# Patient Record
Sex: Male | Born: 1960
Health system: Southern US, Community
[De-identification: ages and names within clinical notes are randomized; demographics above are authoritative.]

## PROBLEM LIST (undated history)

## (undated) DIAGNOSIS — G4730 Sleep apnea, unspecified: Secondary | ICD-10-CM

## (undated) DIAGNOSIS — I10 Essential (primary) hypertension: Secondary | ICD-10-CM

## (undated) DIAGNOSIS — F0390 Unspecified dementia without behavioral disturbance: Secondary | ICD-10-CM

## (undated) DIAGNOSIS — E785 Hyperlipidemia, unspecified: Secondary | ICD-10-CM

## (undated) DIAGNOSIS — M545 Low back pain, unspecified: Secondary | ICD-10-CM

## (undated) DIAGNOSIS — T7840XA Allergy, unspecified, initial encounter: Secondary | ICD-10-CM

## (undated) DIAGNOSIS — Z87442 Personal history of urinary calculi: Secondary | ICD-10-CM

## (undated) DIAGNOSIS — L237 Allergic contact dermatitis due to plants, except food: Secondary | ICD-10-CM

## (undated) DIAGNOSIS — F419 Anxiety disorder, unspecified: Secondary | ICD-10-CM

## (undated) DIAGNOSIS — J309 Allergic rhinitis, unspecified: Secondary | ICD-10-CM

## (undated) HISTORY — PX: ARTHROSCOPIC REPAIR ACL: SUR80

## (undated) HISTORY — DX: Allergic rhinitis, unspecified: J30.9

## (undated) HISTORY — DX: Hyperlipidemia, unspecified: E78.5

## (undated) HISTORY — DX: Personal history of urinary calculi: Z87.442

## (undated) HISTORY — DX: Allergic contact dermatitis due to plants, except food: L23.7

## (undated) HISTORY — DX: Essential (primary) hypertension: I10

## (undated) HISTORY — DX: Low back pain: M54.5

## (undated) HISTORY — PX: SPINE SURGERY: SHX786

## (undated) HISTORY — DX: Allergy, unspecified, initial encounter: T78.40XA

## (undated) HISTORY — PX: LITHOTRIPSY: SUR834

## (undated) HISTORY — DX: Low back pain, unspecified: M54.50

## (undated) HISTORY — DX: Sleep apnea, unspecified: G47.30

## (undated) HISTORY — PX: TRIGGER FINGER RELEASE: SHX641

## (undated) HISTORY — PX: OTHER SURGICAL HISTORY: SHX169

---

## 1996-08-15 HISTORY — PX: ARTHROSCOPIC REPAIR ACL: SUR80

## 1996-08-15 HISTORY — PX: TRIGGER FINGER RELEASE: SHX641

## 1998-08-15 LAB — FECAL OCCULT BLOOD, GUAIAC: Fecal Occult Blood: NEGATIVE

## 2004-06-18 ENCOUNTER — Ambulatory Visit: Payer: Self-pay | Admitting: Family Medicine

## 2005-01-19 ENCOUNTER — Ambulatory Visit: Payer: Self-pay | Admitting: Family Medicine

## 2005-02-17 ENCOUNTER — Ambulatory Visit: Payer: Self-pay | Admitting: Family Medicine

## 2005-03-21 ENCOUNTER — Ambulatory Visit: Payer: Self-pay | Admitting: Family Medicine

## 2005-04-07 ENCOUNTER — Ambulatory Visit: Payer: Self-pay | Admitting: Family Medicine

## 2006-04-07 ENCOUNTER — Ambulatory Visit: Payer: Self-pay | Admitting: Family Medicine

## 2006-05-11 ENCOUNTER — Ambulatory Visit (HOSPITAL_COMMUNITY): Admission: RE | Admit: 2006-05-11 | Discharge: 2006-05-11 | Payer: Self-pay | Admitting: Urology

## 2006-12-27 ENCOUNTER — Encounter: Payer: Self-pay | Admitting: Family Medicine

## 2007-03-20 ENCOUNTER — Telehealth (INDEPENDENT_AMBULATORY_CARE_PROVIDER_SITE_OTHER): Payer: Self-pay | Admitting: *Deleted

## 2007-05-02 ENCOUNTER — Telehealth: Payer: Self-pay | Admitting: Family Medicine

## 2007-05-02 DIAGNOSIS — M549 Dorsalgia, unspecified: Secondary | ICD-10-CM

## 2007-05-02 DIAGNOSIS — M545 Low back pain, unspecified: Secondary | ICD-10-CM | POA: Insufficient documentation

## 2007-12-31 ENCOUNTER — Encounter: Payer: Self-pay | Admitting: Family Medicine

## 2008-06-27 ENCOUNTER — Ambulatory Visit: Payer: Self-pay | Admitting: Family Medicine

## 2008-06-27 DIAGNOSIS — E785 Hyperlipidemia, unspecified: Secondary | ICD-10-CM | POA: Insufficient documentation

## 2008-06-27 DIAGNOSIS — J309 Allergic rhinitis, unspecified: Secondary | ICD-10-CM | POA: Insufficient documentation

## 2008-07-01 ENCOUNTER — Ambulatory Visit: Payer: Self-pay | Admitting: Pulmonary Disease

## 2008-07-01 DIAGNOSIS — G4710 Hypersomnia, unspecified: Secondary | ICD-10-CM | POA: Insufficient documentation

## 2008-07-01 DIAGNOSIS — G4730 Sleep apnea, unspecified: Secondary | ICD-10-CM

## 2008-07-22 ENCOUNTER — Ambulatory Visit (HOSPITAL_BASED_OUTPATIENT_CLINIC_OR_DEPARTMENT_OTHER): Admission: RE | Admit: 2008-07-22 | Discharge: 2008-07-22 | Payer: Self-pay | Admitting: Pulmonary Disease

## 2008-07-22 ENCOUNTER — Encounter: Payer: Self-pay | Admitting: Pulmonary Disease

## 2008-07-30 ENCOUNTER — Ambulatory Visit: Payer: Self-pay | Admitting: Pulmonary Disease

## 2008-08-04 ENCOUNTER — Encounter: Payer: Self-pay | Admitting: Pulmonary Disease

## 2008-08-07 ENCOUNTER — Encounter: Payer: Self-pay | Admitting: Pulmonary Disease

## 2008-09-02 ENCOUNTER — Encounter: Payer: Self-pay | Admitting: Pulmonary Disease

## 2008-09-04 ENCOUNTER — Ambulatory Visit: Payer: Self-pay | Admitting: Pulmonary Disease

## 2008-10-23 ENCOUNTER — Encounter: Payer: Self-pay | Admitting: Pulmonary Disease

## 2008-10-28 ENCOUNTER — Ambulatory Visit: Payer: Self-pay | Admitting: Pulmonary Disease

## 2008-11-10 ENCOUNTER — Telehealth: Payer: Self-pay | Admitting: Family Medicine

## 2008-12-30 ENCOUNTER — Ambulatory Visit: Payer: Self-pay | Admitting: Family Medicine

## 2008-12-31 LAB — CONVERTED CEMR LAB
Direct LDL: 138.8 mg/dL
HDL: 55.4 mg/dL (ref >39.00)
Total CHOL/HDL Ratio: 4
Triglycerides: 53 mg/dL (ref 0.0–149.0)
VLDL: 10.6 mg/dL (ref 0.0–40.0)

## 2009-03-12 ENCOUNTER — Encounter: Payer: Self-pay | Admitting: Family Medicine

## 2009-04-28 ENCOUNTER — Ambulatory Visit: Payer: Self-pay | Admitting: Family Medicine

## 2009-04-28 DIAGNOSIS — Z9189 Other specified personal risk factors, not elsewhere classified: Secondary | ICD-10-CM | POA: Insufficient documentation

## 2009-04-29 LAB — CONVERTED CEMR LAB
ALT: 31 units/L (ref 0–53)
AST: 26 units/L (ref 0–37)
Albumin: 4.3 g/dL (ref 3.5–5.2)
Alkaline Phosphatase: 40 units/L (ref 39–117)
Bilirubin, Direct: 0 mg/dL (ref 0.0–0.3)
Chloride: 106 meq/L (ref 96–112)
GFR calc non Af Amer: 84.5 mL/min (ref >60)
HCT: 40.9 % (ref 39.0–52.0)
MCHC: 34.9 g/dL (ref 30.0–36.0)
Monocytes Absolute: 0.3 10*3/uL (ref 0.1–1.0)
Neutro Abs: 3.2 10*3/uL (ref 1.4–7.7)
Neutrophils Relative %: 62.2 % (ref 43.0–77.0)
Potassium: 4.7 meq/L (ref 3.5–5.1)
RDW: 12.4 % (ref 11.5–14.6)
TSH: 3.21 microintl units/mL (ref 0.35–5.50)
Total Protein: 7 g/dL (ref 6.0–8.3)
WBC: 5.1 10*3/uL (ref 4.5–10.5)

## 2009-05-08 ENCOUNTER — Ambulatory Visit: Payer: Self-pay | Admitting: Pulmonary Disease

## 2009-05-08 ENCOUNTER — Ambulatory Visit: Payer: Self-pay | Admitting: Family Medicine

## 2009-05-21 ENCOUNTER — Ambulatory Visit: Payer: Self-pay | Admitting: Family Medicine

## 2009-05-21 LAB — CONVERTED CEMR LAB: OCCULT 2: NEGATIVE

## 2009-05-25 ENCOUNTER — Encounter (INDEPENDENT_AMBULATORY_CARE_PROVIDER_SITE_OTHER): Payer: Self-pay | Admitting: *Deleted

## 2009-05-25 LAB — CONVERTED CEMR LAB
Glucose, 1 Hour GTT: 180 mg/dL
Glucose, Fasting: 105 mg/dL — ABNORMAL HIGH (ref 70–99)

## 2009-06-03 ENCOUNTER — Encounter: Payer: Self-pay | Admitting: Pulmonary Disease

## 2009-06-24 ENCOUNTER — Ambulatory Visit: Payer: Self-pay | Admitting: Pulmonary Disease

## 2009-09-29 ENCOUNTER — Telehealth: Payer: Self-pay | Admitting: Family Medicine

## 2009-12-24 ENCOUNTER — Ambulatory Visit: Payer: Self-pay | Admitting: Family Medicine

## 2009-12-24 ENCOUNTER — Encounter: Admission: RE | Admit: 2009-12-24 | Discharge: 2009-12-24 | Payer: Self-pay | Admitting: Family Medicine

## 2009-12-24 DIAGNOSIS — R4589 Other symptoms and signs involving emotional state: Secondary | ICD-10-CM | POA: Insufficient documentation

## 2009-12-24 DIAGNOSIS — M25559 Pain in unspecified hip: Secondary | ICD-10-CM | POA: Insufficient documentation

## 2010-01-05 ENCOUNTER — Encounter: Payer: Self-pay | Admitting: Family Medicine

## 2010-02-16 ENCOUNTER — Encounter: Payer: Self-pay | Admitting: Family Medicine

## 2010-09-12 LAB — CONVERTED CEMR LAB
ALT: 32 units/L (ref 0–53)
AST: 21 units/L (ref 0–37)
Albumin: 4.3 g/dL (ref 3.5–5.2)
BUN: 15 mg/dL (ref 6–23)
Bilirubin, Direct: 0.1 mg/dL (ref 0.0–0.3)
CO2: 32 meq/L (ref 19–32)
Chloride: 105 meq/L (ref 96–112)
Cholesterol: 225 mg/dL (ref 0–200)
Direct LDL: 142.6 mg/dL
Eosinophils Absolute: 0.2 10*3/uL (ref 0.0–0.7)
GFR calc Af Amer: 103 mL/min
GFR calc non Af Amer: 85 mL/min
Glucose, Bld: 100 mg/dL — ABNORMAL HIGH (ref 70–99)
HCT: 43.3 % (ref 39.0–52.0)
Lymphocytes Relative: 28.1 % (ref 12.0–46.0)
Potassium: 4.5 meq/L (ref 3.5–5.1)
RBC: 4.95 M/uL (ref 4.22–5.81)
RDW: 12.1 % (ref 11.5–14.6)
TSH: 2.99 microintl units/mL (ref 0.35–5.50)
Triglycerides: 60 mg/dL (ref 0–149)
VLDL: 12 mg/dL (ref 0–40)
WBC: 5.4 10*3/uL (ref 4.5–10.5)

## 2010-09-14 NOTE — Progress Notes (Signed)
Summary: wants muscle relaxer  Phone Note Call from Patient Call back at Home Phone [phone]   Caller: Patient Call For: Judith Part MD Summary of Call: Pt is complaining of muscle spasms in his back, he says he gets these off and on.  He is asking for a muscle relaxer to be called to [facility] in .  He is using heat and taking ibuprofen. Initial call taken by: Lowella Petties CMA,  September 29, 2009 12:03 PM  Follow-up for Phone Call        can try flexeril with caution (sedation) if worse or any neurologic changes - f/u if not imp 2-3 d f/u px written on EMR for call in  Follow-up by: Judith Part MD,  September 29, 2009 12:40 PM  Additional Follow-up for Phone Call Additional follow up Details #1::        Medication phoned to pharmacy. Left message on voicemail  in detail.  Personalized VM.  Additional Follow-up by: Delilah Shan CMA (AAMA),  September 29, 2009 3:03 PM    New/Updated Medications: FLEXERIL 10 MG TABS (CYCLOBENZAPRINE HCL) 1 by mouth up to three times a day as needed back pain watch for sedation Prescriptions: FLEXERIL 10 MG TABS (CYCLOBENZAPRINE HCL) 1 by mouth up to three times a day as needed back pain watch for sedation  #10 x 0   Entered and Authorized by:   Judith Part MD   Signed by:   Delilah Shan CMA (AAMA) on 09/29/2009   Method used:   Telephoned to ...       [facility] [address] (retail)       [address] [place], [place]  [address]       Ph: [phone]       Fax: [phone]   RxID:   [id number]

## 2010-09-14 NOTE — Letter (Signed)
Summary: [facility]  [facility]   Imported By: Lanelle Bal 02/24/2010 13:45:02  _____________________________________________________________________  External Attachment:    Type:   Image     Comment:   External Document

## 2010-09-14 NOTE — Consult Note (Signed)
Summary: [facility]  [facility]   Imported By: Lanelle Bal 01/20/2010 08:27:59  _____________________________________________________________________  External Attachment:    Type:   Image     Comment:   External Document

## 2010-09-14 NOTE — Assessment & Plan Note (Signed)
Summary: BACK PAIN/DLO   Vital Signs:  Patient profile:   50 year old male Height:      69.5 inches Weight:      179.25 pounds BMI:     26.19 Temp:     98.1 degrees F oral Pulse rate:   60 / minute Pulse rhythm:   regular BP sitting:   128 / 80  (left arm) Cuff size:   regular  Vitals Entered By: Lewanda Rife LPN (Dec 24, 2009 8:08 AM) CC: lower back pain, dullache no spasms at this time but hips are hurting also. Pt is also under alot of stress   History of Present Illness: has had back spasms in past  this is different and will not go away  more of a dull ache  uses flexeril as needed -- since feb spasms are intermittent  low back - right into the middle -- now rad into hips and groin   sitting makes it worse  better when up and around -- walking  seldom takes flexeril -- except for spasm and knocks him out has takes ibuprofen -- it does help , and aleve at times   did x ray and saw ortho in distant past saw scoliosis - but otherwise ok  given flexeril and arthrotec   worse to bend back   is also under a lot of stress - spasms may react to that  no danger situation  it could be making him depressed   family issues -- and does not have a lot of support  up and down with wife and her alcohol use   Allergies (verified): No Known Drug Allergies  Past History:  Past Surgical History: Last updated: 06/27/2008 DRE 3/04, 8/06  Family History: Last updated: 04/28/2009 father- heart problems , DM2 MGF with leukemia  PGF with MI  Maunt with SLE Maunt with cirrhosis (non alcoholic)  Social History: Last updated: 07/01/2008 Never Smoked Alcohol use-yes (occ wine)  Drug use-no married laid off x 2 yrs  Risk Factors: Smoking Status: never (06/27/2008)  Past Medical History: Kidney Stones (recur (11/00) frequent poison ivy Allergic rhinitis Hyperlipidemia low back pain  Review of Systems General:  Denies fatigue, loss of appetite, and malaise. Eyes:   Denies blurring. CV:  Denies chest pain or discomfort, lightheadness, and palpitations. Resp:  Denies cough, shortness of breath, and wheezing. GI:  Denies abdominal pain, bloody stools, change in bowel habits, and indigestion. GU:  Denies discharge, dysuria, and hematuria. MS:  Complains of low back pain and stiffness; denies joint redness, joint swelling, and muscle weakness. Derm:  Denies itching, lesion(s), poor wound healing, and rash. Neuro:  Denies numbness, tingling, and weakness. Psych:  Denies easily tearful, irritability, panic attacks, sense of great danger, and suicidal thoughts/plans. Endo:  Denies excessive thirst and excessive urination. Heme:  Denies abnormal bruising and bleeding.  Physical Exam  General:  Well-developed,well-nourished,in no acute distress; alert,appropriate and cooperative throughout examination Head:  normocephalic, atraumatic, and no abnormalities observed.   Eyes:  vision grossly intact, pupils equal, pupils round, and pupils reactive to light.  no conjunctival pallor, injection or icterus  Mouth:  pharynx pink and moist.   Neck:  supple with full rom and no masses or thyromegally, no JVD or carotid bruit  Chest Wall:  No deformities, masses, tenderness or gynecomastia noted. Lungs:  Normal respiratory effort, chest expands symmetrically. Lungs are clear to auscultation, no crackles or wheezes. Heart:  Normal rate and regular rhythm. S1 and S2 normal  without gallop, murmur, click, rub or other extra sounds. Abdomen:  no suprapubic tenderness or fullness felt  Msk:  tender L3-L5 loss of lordosis in LS  pain on ext and L lat bend - nl twist neg slr nl rom hips  no abd tenderness nl gait  no cva tenderness Extremities:  No clubbing, cyanosis, edema, or deformity noted with normal full range of motion of all joints.   Neurologic:  strength normal in all extremities, sensation intact to light touch, gait normal, and DTRs symmetrical and normal.     Skin:  Intact without suspicious lesions or rashes Cervical Nodes:  No lymphadenopathy noted Inguinal Nodes:  No significant adenopathy Psych:  seems stressed - but talks freely with good eye contact and comm skills and insight   Impression & Recommendations:  Problem # 1:  BACK PAIN (ICD-724.5) Assessment Deteriorated this has changed to be more constant with rad to hips  check ls and hip x rays and update  replace otc with mobic flexeril as needed  heat and gentle stretch  given back handout from [facility]  update if worse or neurol symptoms make plan when results return The following medications were removed from the medication list:    Ibuprofen 200 Mg Tabs (Ibuprofen) .Marland Kitchen... As needed    Aleve 220 Mg Tabs (Naproxen sodium) .Marland Kitchen... As needed His updated medication list for this problem includes:    Flexeril 10 Mg Tabs (Cyclobenzaprine hcl) .Marland Kitchen... 1 by mouth up to three times a day as needed back pain watch for sedation    Mobic 15 Mg Tabs (Meloxicam) .Marland Kitchen... 1 by mouth once daily with food  Orders: Radiology Referral (Radiology) Prescription Created Electronically [phone])  Problem # 2:  STRESS REACTION, ACUTE, WITH EMOTIONAL DISTURBANCE (ICD-308.0) Assessment: New  disc situational stress/ coping mech in detail I feel he would benefit from counseling  will call if he wants a referral- is thinking about it  disc situational stress/coping tech/ support sources/ symtp and tx opt today  Orders: Prescription Created Electronically [phone])  Complete Medication List: 1)  Sudophed 30 Mg Tabs (Pseudoephedrine hcl) .... One by mouth daily as needed 2)  Cpap 12 Cm, Full Face  3)  Nasonex 50 Mcg/act Susp (Mometasone furoate) .... 2 sprays in each nostril once daily as needed 4)  Flexeril 10 Mg Tabs (Cyclobenzaprine hcl) .Marland Kitchen.. 1 by mouth up to three times a day as needed back pain watch for sedation 5)  Allegra 180 Mg Tabs (Fexofenadine hcl) .... Otc as directed. 6)  Mobic 15 Mg Tabs  (Meloxicam) .Marland Kitchen.. 1 by mouth once daily with food  Patient Instructions: 1)  stop ibuprofen and aleve 2)  start mobic with food once per day 3)  tylenol is ok  4)  flexeril for spasm as needed  5)  let me know if you need stronger pain control  6)  use heat when you can  7)  keep moving but avoid heavy lifting  8)  we will send you for x rays at check out  Prescriptions: MOBIC 15 MG TABS (MELOXICAM) 1 by mouth once daily with food  #30 x 1   Entered and Authorized by:   Judith Part MD   Signed by:   Judith Part MD on 12/24/2009   Method used:   Electronically to        [facility] [address] (retail)       [address]  [place], [place]  [address]       Ph: [phone]       Fax: [phone]   RxID:   [id number]   Current Allergies (reviewed today): No known allergies

## 2010-09-16 ENCOUNTER — Ambulatory Visit: Payer: Self-pay | Admitting: Pulmonary Disease

## 2010-09-16 ENCOUNTER — Ambulatory Visit: Admit: 2010-09-16 | Payer: Self-pay | Admitting: Pulmonary Disease

## 2010-09-17 ENCOUNTER — Telehealth: Payer: Self-pay | Admitting: Pulmonary Disease

## 2010-09-19 ENCOUNTER — Encounter: Payer: Self-pay | Admitting: Pulmonary Disease

## 2010-09-22 NOTE — Progress Notes (Signed)
Summary: nos appt  Phone Note Call from Patient   Caller: [email or web address]  Call For: alva Summary of Call: Rsc nos from 2/2 to 2/15. Initial call taken by: Darletta Moll,  September 17, 2010 9:50 AM

## 2010-09-29 ENCOUNTER — Ambulatory Visit: Payer: Self-pay | Admitting: Pulmonary Disease

## 2010-09-30 ENCOUNTER — Ambulatory Visit (INDEPENDENT_AMBULATORY_CARE_PROVIDER_SITE_OTHER): Payer: Federal, State, Local not specified - PPO | Admitting: Pulmonary Disease

## 2010-09-30 ENCOUNTER — Encounter: Payer: Self-pay | Admitting: Pulmonary Disease

## 2010-09-30 DIAGNOSIS — G4710 Hypersomnia, unspecified: Secondary | ICD-10-CM

## 2010-09-30 DIAGNOSIS — G4730 Sleep apnea, unspecified: Secondary | ICD-10-CM

## 2010-09-30 NOTE — Letter (Signed)
Summary: [facility]  [facility]   Imported By: Lester Cassel 09/24/2010 13:34:49  _____________________________________________________________________  External Attachment:    Type:   Image     Comment:   External Document

## 2010-10-06 NOTE — Assessment & Plan Note (Signed)
Summary: rov pt rsc/jd   Copy to:  Dr. Roxy Manns Primary Provider/Referring Provider:  Dr. Roxy Manns  CC:  Pt states not sleeping well, unable to sleep on back, increased stress, and only getting 4 to 5 hours of sleep.  History of Present Illness: 47/M for FU of obstructive/ complex sleep apnea . Reviewed study-12/16 >> severe obstructive sleep apnea , ahi 30/h, corrected by CPAP 10 cm medium full face mask, heated humidity. Central apneas emerged early on CPAP raising the possibility of complex sleep apnea. He has responded well to CPAP therapy. Good  complaince on multiple downloads  September 30, 2010 4:38 PM  has adjusted well to CPAP after initial rough period. Sleep time varies from 4-6h, wonders if pressure should be increased. mask ok, only chnages it annually . No increase in somnolence or fatigue wt lower by 7 lbs  8/9-09/19/10 >> download on 12 cm >> good compliance > 6h avg, no residuals    Preventive Screening-Counseling & Management  Alcohol-Tobacco     Smoking Status: never  Current Medications (verified): 1)  Sudophed 30 Mg Tabs (Pseudoephedrine Hcl) .... One By Mouth Daily As Needed 2)  Cpap 12 Cm, Full Face 3)  Nasonex 50 Mcg/act Susp (Mometasone Furoate) .... 2 Sprays in Each Nostril Once Daily As Needed 4)  Flexeril 10 Mg Tabs (Cyclobenzaprine Hcl) .Marland Kitchen.. 1 By Mouth Up To Three Times A Day As Needed Back Pain Watch For Sedation 5)  Allegra 180 Mg Tabs (Fexofenadine Hcl) .... Otc As Directed. 6)  Mobic 15 Mg Tabs (Meloxicam) .Marland Kitchen.. 1 By Mouth Once Daily With Food  Allergies (verified): No Known Drug Allergies  Past History:  Past Medical History: Last updated: 12/24/2009 Kidney Stones (recur (11/00) frequent poison ivy Allergic rhinitis Hyperlipidemia low back pain  Social History: Last updated: 07/01/2008 Never Smoked Alcohol use-yes (occ wine)  Drug use-no married laid off x 2 yrs  Review of Systems  The patient denies anorexia, fever, weight  loss, weight gain, vision loss, decreased hearing, hoarseness, chest pain, syncope, dyspnea on exertion, peripheral edema, prolonged cough, headaches, hemoptysis, abdominal pain, melena, hematochezia, severe indigestion/heartburn, muscle weakness, transient blindness, difficulty walking, depression, unusual weight change, abnormal bleeding, enlarged lymph nodes, and angioedema.    Vital Signs:  Patient profile:   50 year old male Height:      69.5 inches Weight:      181 pounds BMI:     26.44 O2 Sat:      99 % on Room air Temp:     98.2 degrees F oral Pulse rate:   59 / minute BP sitting:   118 / 86  (left arm) Cuff size:   regular  Vitals Entered By: Zackery Barefoot CMA (September 30, 2010 4:32 PM)  O2 Flow:  Room air CC: Pt states not sleeping well, unable to sleep on back, increased stress, only getting 4 to 5 hours of sleep Comments Medications reviewed with patient Verified contact number and pharmacy with patient Zackery Barefoot CMA  September 30, 2010 4:32 PM    Physical Exam  Additional Exam:  Gen. Pleasant, well-nourished, in no distress ENT - no lesions, no post nasal drip, class 2 airway Neck: No JVD, no thyromegaly, no carotid bruits Lungs: no use of accessory muscles, no dullness to percussion, clear without rales or rhonchi  Cardiovascular: Rhythm regular, heart sounds  normal, no murmurs or gallops, no peripheral edema Musculoskeletal: No deformities, no cyanosis or clubbing      Impression & Recommendations:  Problem # 1:  HYPERSOMNIA, ASSOCIATED WITH SLEEP APNEA (ICD-780.53)  Residual events acceptable - I do not think increasing pressure further will help , only owrsen compliance. Do not think, ASV necessary here Compliance encouraged, wt loss emphasized, asked to avoid meds with sedative side effects, cautioned against driving when sleepy. ct CPAP 12 cm No need for nuvigil.  Orders: Est. Patient Level III ([address])  Patient Instructions: 1)  Copy sent  to: 2)  Please schedule a follow-up appointment in 1 year. 3)  Your current pressure is adequate

## 2010-12-31 NOTE — Procedures (Signed)
NAME:  Adam Ballard, Adam Ballard NO.:  [id number]   MEDICAL RECORD NO.:  [id number]          PATIENT TYPE:  OUT   LOCATION:  SLEEP CENTER                 FACILITY:  [facility]   PHYSICIAN:  Oretha Milch, MD      DATE OF BIRTH:  10-31-1960   DATE OF STUDY:                            NOCTURNAL POLYSOMNOGRAM   REFERRING PHYSICIAN:   INDICATION FOR THE STUDY:  Excessive daytime sleepiness and fatigue with  snoring and witnessed apneas in this 50 year old gentleman with weight  178 pounds, BMI 26, height 5 feet 10 inches, and neck size 14 inches.   EPWORTH SLEEPINESS SCORE:  12.   This splint polysomnogram was performed in a sleep technologist in  attendance.  EEG, EOG, EMG, EKG, and respiratory parameters were  recorded.  Sleep stages, limb movements, cardiac and respiratory data  were scored according to criteria laid out by the American Academy of  Sleep Medicine.   SLEEP ARCHITECTURE:  Light-outs was at 10:29 p.m. and lights-on was at  5:46 a.m.  CPAP was initiated at 1:19 a.m.  During the diagnostic  portion, total sleep time was 123 minutes with a sleep period time of  126 minutes.  Sleep latency was 44.5 minutes and sleep efficiency was  72%.  Sleep stages as a percentage of total sleep time was N1 16.3%, N2  78.9%, N3 0%, and REM sleep 4.9% (6 minutes).  He spent 69 minutes in  supine sleep.   During the titration portion, he achieved 83.5 minutes (41.6%) of REM  sleep, longest REM period was at 5 a.m. and 19 minutes of supine REM  sleep was noted.   RESPIRATORY DATA:  During the diagnostic portion, the apnea-hypopnea  index was 22.9 events per hour with 5 obstructive apneas, 3 central  apneas, 1 mixed apnea, and 38 hypopneas, 22 RERAs were noted with an RDF  33.7 events per hour.   Due to this degree of respiratory disturbance, CPAP was initiated with a  full face mask at 4 cm and titrated to 13 cm for snoring and respiratory  events.  Had a level 6 cm for 70  minutes of sleep including 24.5 minutes  of REM sleep, 10 obstructive apneas, 4 central apneas, 5 mixed apneas,  and 1 hypopnea was noted with an AHI of 17.1 events per hour and the  lowest desaturation of 89%.  At the level of 10 cm, for 8.5 minutes, 5  hypopneas were noted.  Central apneas seem to emerge at higher level, at  level of 12 cm, for 27 minutes with sleep, 10 central apneas, 4  hypopneas, 1 mixed apnea, and 3 obstructive apneas were noted.  At the  level of 13 cm, for 29.5 minutes of REM sleep, no events were noted.  He  seem to tolerate CPAP well.   AROUSAL DATA:  During the diagnostic portion, he had a total 39 arousals  with an arousal index of 19 events per hour, at least 25 are spontaneous  and rest were associated with respiratory movements.   LIMB MOVEMENT DATA:  No significant limb movements were noted.   OXYGEN SATURATION DATA:  The  lowest desaturation during the diagnostic  portion was 86% at a level of 10 cm, lowest desaturation was 93%, and a  12 cm of the lowest desaturation was 91%.   CARDIAC DATA:  The lowest heart rate was 53 beats per minute during non-  REM sleep and the highest heart rate was 79 beats per minute during non-  REM sleep.   DISCUSSION:  He was desensitized with a Resmed Quattro full face mask  medium size.Central apneas emerged even at lower levels of CPAP and  seemed to increase at level higher than 10 cm.  He seemed to tolerate  the CPAP quite well.   IMPRESSION:  1. Severe obstructive sleep apnea with predominant hypopneas causing      oxygen desaturation and sleep fragmentation.  2. This seem to be corrected by continuous positive airway pressure of      10-13 cm.  However, central apneas seem to emerge at higher levels      of continuous positive airway pressure.  Emergence of central and      mixed apneas hardly may indicate complex sleep apnea.  3. No evidence of cardiac arrhythmias, limb movements, or behavior      disturbance  during sleep.  An isolated episode of bruxism was seen.   RECOMMENDATIONS:  1. CPAP should be initiated with a medium size full face mask at 10 cm      and its symptoms can be monitored at this level.  2. He should be asked to avoid medications of sedative with side      effects and asked avoid driving when sleepy and cautioned against      driving when sleepy.  3. If symptoms persist, I would consider treatment for complex sleep      apnea.      Oretha Milch, MD  Electronically Signed     RVA/MEDQ  D:  07/30/2008 16:52:02  T:  07/31/2008 84:13:24  Job:  [id number]

## 2011-03-14 ENCOUNTER — Other Ambulatory Visit: Payer: Self-pay | Admitting: *Deleted

## 2011-03-14 MED ORDER — MOMETASONE FUROATE 50 MCG/ACT NA SUSP: 2.0000 | Freq: Every day | NASAL | Status: DC

## 2011-04-19 ENCOUNTER — Ambulatory Visit
Admission: RE | Admit: 2011-04-19 | Discharge: 2011-04-19 | Disposition: A | Discharge status: Home / Self Care | Payer: Federal, State, Local not specified - PPO | Source: Ambulatory Visit | Attending: Orthopedic Surgery | Admitting: Orthopedic Surgery

## 2011-04-19 ENCOUNTER — Other Ambulatory Visit: Payer: Self-pay | Admitting: Orthopedic Surgery

## 2011-04-19 DIAGNOSIS — M545 Low back pain, unspecified: Secondary | ICD-10-CM

## 2011-05-26 ENCOUNTER — Encounter (HOSPITAL_COMMUNITY)
Admission: RE | Admit: 2011-05-26 | Discharge: 2011-05-26 | Disposition: A | Discharge status: Home / Self Care | Payer: Federal, State, Local not specified - PPO | Source: Ambulatory Visit | Attending: Orthopedic Surgery | Admitting: Orthopedic Surgery

## 2011-05-26 LAB — CBC
MCHC: 35.3 g/dL (ref 30.0–36.0)
MCV: 86.1 fL (ref 78.0–100.0)
RBC: 4.61 MIL/uL (ref 4.22–5.81)
WBC: 6.2 10*3/uL (ref 4.0–10.5)

## 2011-05-26 LAB — DIFFERENTIAL
Eosinophils Absolute: 0.2 10*3/uL (ref 0.0–0.7)
Eosinophils Relative: 4 % (ref 0–5)
Monocytes Absolute: 0.4 10*3/uL (ref 0.1–1.0)
Monocytes Relative: 7 % (ref 3–12)

## 2011-05-26 LAB — COMPREHENSIVE METABOLIC PANEL
ALT: 38 U/L (ref 0–53)
BUN: 21 mg/dL (ref 6–23)
Chloride: 104 mEq/L (ref 96–112)
Creatinine, Ser: 0.91 mg/dL (ref 0.50–1.35)
GFR calc non Af Amer: >90 mL/min (ref >90)
Potassium: 4.7 mEq/L (ref 3.5–5.1)
Sodium: 143 mEq/L (ref 135–145)
Total Bilirubin: 0.5 mg/dL (ref 0.3–1.2)
Total Protein: 7.2 g/dL (ref 6.0–8.3)

## 2011-05-26 LAB — URINALYSIS, ROUTINE W REFLEX MICROSCOPIC
Bilirubin Urine: NEGATIVE
Glucose, UA: NEGATIVE mg/dL
Ketones, ur: NEGATIVE mg/dL
Leukocytes, UA: NEGATIVE
Nitrite: NEGATIVE
Protein, ur: NEGATIVE mg/dL
pH: 7.5 (ref 5.0–8.0)

## 2011-06-01 HISTORY — PX: BACK SURGERY: SHX140

## 2011-06-02 ENCOUNTER — Inpatient Hospital Stay (HOSPITAL_COMMUNITY): Payer: Federal, State, Local not specified - PPO

## 2011-06-02 ENCOUNTER — Inpatient Hospital Stay (HOSPITAL_COMMUNITY)
Admission: RE | Admit: 2011-06-02 | Discharge: 2011-06-04 | DRG: 756 | Disposition: A | Discharge status: Home / Self Care | Payer: Federal, State, Local not specified - PPO | Source: Ambulatory Visit | Attending: Orthopedic Surgery | Admitting: Orthopedic Surgery

## 2011-06-02 DIAGNOSIS — M47817 Spondylosis without myelopathy or radiculopathy, lumbosacral region: Secondary | ICD-10-CM | POA: Diagnosis present

## 2011-06-02 DIAGNOSIS — M5126 Other intervertebral disc displacement, lumbar region: Principal | ICD-10-CM | POA: Diagnosis present

## 2011-06-02 DIAGNOSIS — IMO0002 Reserved for concepts with insufficient information to code with codable children: Secondary | ICD-10-CM | POA: Diagnosis present

## 2011-06-02 DIAGNOSIS — Z01812 Encounter for preprocedural laboratory examination: Secondary | ICD-10-CM

## 2011-06-02 DIAGNOSIS — Z01810 Encounter for preprocedural cardiovascular examination: Secondary | ICD-10-CM

## 2011-06-02 DIAGNOSIS — G4733 Obstructive sleep apnea (adult) (pediatric): Secondary | ICD-10-CM | POA: Diagnosis present

## 2011-06-02 DIAGNOSIS — Z23 Encounter for immunization: Secondary | ICD-10-CM

## 2011-06-02 DIAGNOSIS — M26609 Unspecified temporomandibular joint disorder, unspecified side: Secondary | ICD-10-CM | POA: Diagnosis present

## 2011-06-02 LAB — ABO/RH: ABO/RH(D): A NEG

## 2011-06-02 LAB — TYPE AND SCREEN: Antibody Screen: NEGATIVE

## 2011-06-10 NOTE — Discharge Summary (Signed)
  NAMEEZRA, DENNE NO.:  [id number]  MEDICAL RECORD NO.:  [id number]  LOCATION:  5040                         FACILITY:  [facility]  PHYSICIAN:  Estill Bamberg, MD      DATE OF BIRTH:  06-20-1961  DATE OF ADMISSION:  06/02/2011 DATE OF DISCHARGE:  06/04/2011                              DISCHARGE SUMMARY   ADMISSION DIAGNOSIS:  Left-sided L3 radiculopathy.  DISCHARGE DIAGNOSIS:  Left-sided L3 radiculopathy.  PROCEDURE PERFORMED:  Left-sided L3-4 transforaminal lumbar interbody fusion, performed on June 02, 2011.  ADMISSION HISTORY:  Briefly, Mr. Moch is a very pleasant 50 year old male who presents to me with severe and debilitating pain in the left leg in the distribution of the L3 nerve on the left side.  We did go forward extensive conservative care and the patient continued to have ongoing pain and weakness.  He therefore was admitted on June 02, 2011, for the procedure noted above.  HOSPITAL COURSE:  On June 02, 2011, the patient underwent the procedure noted above.  The patient tolerated the procedure well and was transferred to recovery in stable condition.  The patient was admitted to the floor and was subsequently mobilized with physical therapy.  The patient was evaluated by me in the morning of postop day #1 and afternoon of postop day #2.  On the morning of postop day #2, the patient's pain was very well controlled.  His back pain was minimal.  He had no leg pain.  He was uneventfully discharged home.  BRIEF DISCHARGE INSTRUCTIONS:  The patient will take Percocet for pain and Valium for spasms.  He will adhere to back precautions at all times. He will follow up with me in approximately 2 weeks after his procedure.     Estill Bamberg, MD     MD/MEDQ  D:  06/04/2011  T:  06/04/2011  Job:  [id number]  Electronically Signed by Estill Bamberg  on 06/10/2011 05:33:51 PM
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
## 2011-08-04 ENCOUNTER — Telehealth: Payer: Self-pay | Admitting: Pulmonary Disease

## 2011-08-04 NOTE — Telephone Encounter (Signed)
I spoke with pt and he states he has already gotten a new mask from [facility] and states ahc advised RA refused to sign for him to get a new cpap mask. I spoke with Byrd Hesselbach from [facility] and states they are not showing where this is so. i asked her if anything was needed from [place] and she states no. I advised pt of this and he states he will call the person who called him yesterday and find out what is needed.

## 2012-03-02 ENCOUNTER — Ambulatory Visit (INDEPENDENT_AMBULATORY_CARE_PROVIDER_SITE_OTHER): Payer: Federal, State, Local not specified - PPO | Admitting: Family Medicine

## 2012-03-02 ENCOUNTER — Encounter: Payer: Self-pay | Admitting: Family Medicine

## 2012-03-02 VITALS — BP 124/70 | HR 67 | Temp 98.2°F | Ht 70.0 in | Wt 178.8 lb

## 2012-03-02 DIAGNOSIS — H6120 Impacted cerumen, unspecified ear: Secondary | ICD-10-CM

## 2012-03-02 NOTE — Assessment & Plan Note (Signed)
Attempted simple irrigation without success because cerumen plug was tight and deep in ear canal Sent pt home with inst to use debrox otic soln otc 3 times per week for 3 weeks and then f/u for another ear flush Adv if other symptoms to update me  Adv against using Q tips to clean his ears

## 2012-03-02 NOTE — Progress Notes (Signed)
Subjective:    Patient ID: Adam Ballard, male    DOB: Sep 14, 1960, 51 y.o.   MRN: [id number]  HPI Her for ear problem  Is having buzzing and ringing in his L ear for many weeks now  Affects his hearing  Took allergy medicine and nasonex -- not much congestion No ear pain - just a funny feeling around it  Seems like it is a little swollen in canal No dizziness   No fever   Not a lot of ear infections as a kid No swimming or water in ear    No recent noise exposure   Patient Active Problem List  Diagnosis  . HYPERLIPIDEMIA  . STRESS REACTION, ACUTE, WITH EMOTIONAL DISTURBANCE  . ALLERGIC RHINITIS  . HIP PAIN  . BACK PAIN  . HYPERSOMNIA, ASSOCIATED WITH SLEEP APNEA  . ABDOMINAL PAIN, LEFT LOWER QUADRANT, HX OF   No past medical history on file. No past surgical history on file. History  Substance Use Topics  . Smoking status: Former Games developer  . Smokeless tobacco: Not on file  . Alcohol Use: Yes   No family history on file. No Known Allergies Current Outpatient Prescriptions on File Prior to Visit  Medication Sig Dispense Refill  . cetirizine (ZYRTEC) 10 MG tablet Take 10 mg by mouth daily.      . mometasone (NASONEX) 50 MCG/ACT nasal spray Place 2 sprays into the nose daily.  17 g  3     Patient Active Problem List  Diagnosis  . HYPERLIPIDEMIA  . STRESS REACTION, ACUTE, WITH EMOTIONAL DISTURBANCE  . ALLERGIC RHINITIS  . HIP PAIN  . BACK PAIN  . HYPERSOMNIA, ASSOCIATED WITH SLEEP APNEA  . ABDOMINAL PAIN, LEFT LOWER QUADRANT, HX OF   No past medical history on file. No past surgical history on file. History  Substance Use Topics  . Smoking status: Former Games developer  . Smokeless tobacco: Not on file  . Alcohol Use: Yes   No family history on file. No Known Allergies Current Outpatient Prescriptions on File Prior to Visit  Medication Sig Dispense Refill  . cetirizine (ZYRTEC) 10 MG tablet Take 10 mg by mouth daily.      . mometasone (NASONEX) 50 MCG/ACT  nasal spray Place 2 sprays into the nose daily.  17 g  3     Review of Systems Review of Systems  Constitutional: Negative for fever, appetite change, fatigue and unexpected weight change.  Eyes: Negative for pain and visual disturbance.  ENT pos for tinnitus and muffled sound R ear, neg for pain or drainage  Respiratory: Negative for cough and shortness of breath.   Cardiovascular: Negative for cp or palpitations    Gastrointestinal: Negative for nausea, diarrhea and constipation.  Genitourinary: Negative for urgency and frequency.  Skin: Negative for pallor or rash   Neurological: Negative for weakness, light-headedness, numbness and headaches.  Hematological: Negative for adenopathy. Does not bruise/bleed easily.  Psychiatric/Behavioral: Negative for dysphoric mood. The patient is not nervous/anxious.         Objective:   Physical Exam  Constitutional: He appears well-developed and well-nourished. No distress.  HENT:  Head: Normocephalic and atraumatic.  Right Ear: External ear normal.  Mouth/Throat: Oropharynx is clear and moist.       Total cerumen impaction in L ear No redness Dec hearing to soft tone in L   Eyes: Conjunctivae and EOM are normal. Pupils are equal, round, and reactive to light. No scleral icterus.  Cardiovascular: Normal rate and regular  rhythm.   Skin: Skin is warm and dry. No rash noted. No erythema.  Psychiatric: He has a normal mood and affect.          Assessment & Plan:

## 2012-03-02 NOTE — Patient Instructions (Addendum)
If you develop pain in ear or irritation let me know  It will dry out in next few days and hearing should improve Get debrox otic solution over the counter - and use it as directed 3 times per week for 2 weeks Follow up here in 2 weeks with me for another ear flush

## 2012-03-19 ENCOUNTER — Encounter: Payer: Self-pay | Admitting: Family Medicine

## 2012-03-19 ENCOUNTER — Ambulatory Visit (INDEPENDENT_AMBULATORY_CARE_PROVIDER_SITE_OTHER): Payer: Federal, State, Local not specified - PPO | Admitting: Family Medicine

## 2012-03-19 VITALS — BP 102/68 | HR 51 | Temp 98.2°F | Wt 179.0 lb

## 2012-03-19 DIAGNOSIS — T148 Other injury of unspecified body region: Secondary | ICD-10-CM

## 2012-03-19 DIAGNOSIS — W57XXXA Bitten or stung by nonvenomous insect and other nonvenomous arthropods, initial encounter: Secondary | ICD-10-CM | POA: Insufficient documentation

## 2012-03-19 DIAGNOSIS — H6120 Impacted cerumen, unspecified ear: Secondary | ICD-10-CM

## 2012-03-19 DIAGNOSIS — T148XXA Other injury of unspecified body region, initial encounter: Secondary | ICD-10-CM

## 2012-03-19 NOTE — Patient Instructions (Addendum)
Let me know if any further ear trouble or new symptoms  Keep an eye on the tick bite site- clean with antibacterial soap and water, use otc antibiotic ointment and loosely cover  If redness worsens or swelling/ pain / fever - please call (or if rash or target shaped redness)

## 2012-03-19 NOTE — Assessment & Plan Note (Signed)
Complete resolution after simple ear irrigation on the L  Hearing improved Will use debrox in future at home prn Warned against use of Q tips

## 2012-03-19 NOTE — Progress Notes (Signed)
  Subjective:    Patient ID: Adam Ballard, male    DOB: Sep 16, 1960, 51 y.o.   MRN: [id number]  HPI Here for an ear flush  None out using debrox so far Feels like something broke loose L ear   Also bitten by a tick on  His toe R foot -- 10 days ago  Red area with blister on top  No fever or rash or joint pain that is new   Patient Active Problem List  Diagnosis  . HYPERLIPIDEMIA  . STRESS REACTION, ACUTE, WITH EMOTIONAL DISTURBANCE  . ALLERGIC RHINITIS  . HIP PAIN  . BACK PAIN  . HYPERSOMNIA, ASSOCIATED WITH SLEEP APNEA  . ABDOMINAL PAIN, LEFT LOWER QUADRANT, HX OF  . Cerumen impaction   No past medical history on file. No past surgical history on file. History  Substance Use Topics  . Smoking status: Former Games developer  . Smokeless tobacco: Not on file  . Alcohol Use: Yes   No family history on file. No Known Allergies Current Outpatient Prescriptions on File Prior to Visit  Medication Sig Dispense Refill  . cetirizine (ZYRTEC) 10 MG tablet Take 10 mg by mouth daily.      Marland Kitchen guaiFENesin (MUCINEX) 600 MG 12 hr tablet Take 600 mg by mouth 2 (two) times daily.      . mometasone (NASONEX) 50 MCG/ACT nasal spray Place 2 sprays into the nose daily.  17 g  3       Review of Systems    Review of Systems  Constitutional: Negative for fever, appetite change, fatigue and unexpected weight change.  Eyes: Negative for pain and visual disturbance.  ENT pos for decreased hearing L ear  Respiratory: Negative for cough and shortness of breath.   Cardiovascular: Negative for cp or palpitations    Gastrointestinal: Negative for nausea, diarrhea and constipation.  Genitourinary: Negative for urgency and frequency.  Skin: Negative for pallor or rash  pos for blister on toe Neurological: Negative for weakness, light-headedness, numbness and headaches.  Hematological: Negative for adenopathy. Does not bruise/bleed easily.  Psychiatric/Behavioral: Negative for dysphoric mood. The patient  is not nervous/anxious.      Objective:   Physical Exam  Constitutional: He appears well-developed and well-nourished.  HENT:  Right Ear: External ear normal.       L TM obscured by wet cerumen This was completely cleared with simple ear irrigation Nl app TM and canal  Eyes: Conjunctivae and EOM are normal. Pupils are equal, round, and reactive to light. Right eye exhibits no discharge. Left eye exhibits no discharge.  Neck: Normal range of motion. Neck supple.  Lymphadenopathy:    He has no cervical adenopathy.  Skin: Skin is warm and dry. No rash noted. There is erythema. No pallor.       .5 cm blister with clear fluid in between 4th and 5th toes of R foot  Small amt of redness surrounding it  Blister unroofed-no tick parts present Was cleaned and dressed  No rash No bullseye pattern          Assessment & Plan:

## 2012-03-19 NOTE — Assessment & Plan Note (Signed)
Between 4th and 5th toes on R foot with blister - this was sterilly unroofed - and no remaining tick seen  Pt brought tick with him- is large/ resembling dog tick Suspect all rxn  Cleaned and dressed with triple abx oint and loose bandaid Will be on the look out for fever /rash or other symptoms (see AVS) Will update

## 2012-07-13 IMAGING — RF DG LUMBAR SPINE 2-3V
1 series · 2 of 2 positions shown · non-contrast
Comparison: Lumbar spine MRI 04/19/2011.

CLINICAL DATA: Lumbar fusion.

LUMBAR SPINE - 2-3 VIEW

[Series 1: run · 2 of 2 slices shown]
[im 1/2]
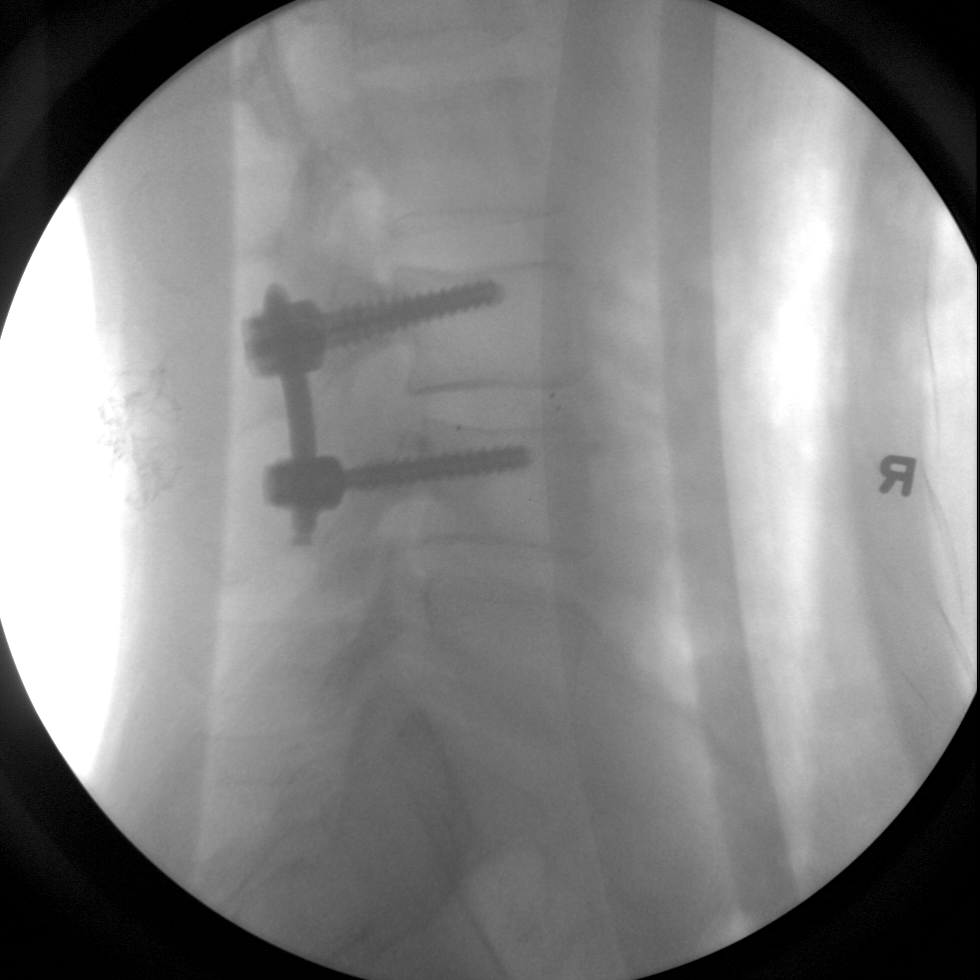
[im 2/2]
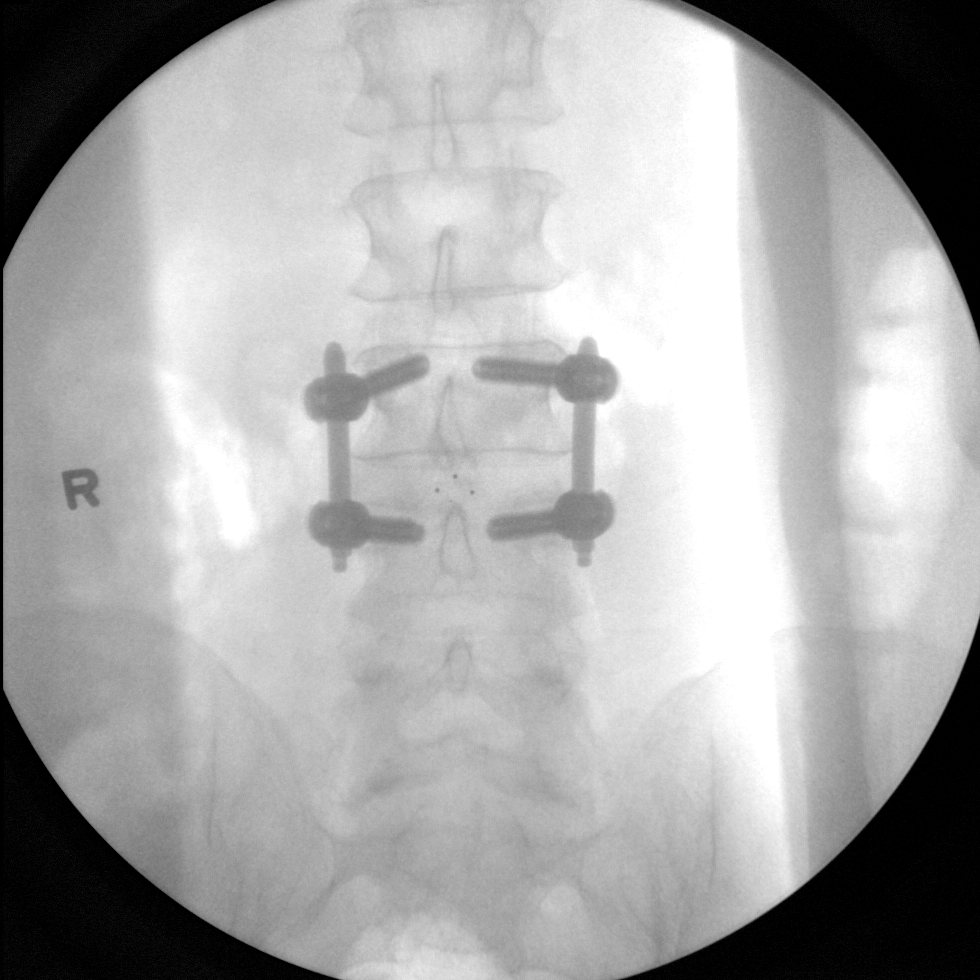

[2 of 2 positions shown; findings below may reference images not displayed]

FINDINGS: Pedicle screws, posterior rods and interbody bone plug at
L3-4.  Good position and alignment without complicating features.
IMPRESSION: L3-4 fusion.

## 2012-08-15 HISTORY — PX: COLONOSCOPY: SHX174

## 2012-10-25 ENCOUNTER — Other Ambulatory Visit: Payer: Self-pay | Admitting: Family Medicine

## 2012-11-22 ENCOUNTER — Other Ambulatory Visit: Payer: Self-pay | Admitting: Family Medicine

## 2012-11-22 NOTE — Telephone Encounter (Signed)
No recent appt and no future appt, ok to refill? 

## 2012-11-22 NOTE — Telephone Encounter (Signed)
Please schedule f/u or PE for end of summer and refil until then, thanks

## 2012-11-23 ENCOUNTER — Encounter: Payer: Self-pay | Admitting: *Deleted

## 2012-11-23 NOTE — Telephone Encounter (Signed)
appt scheduled for 03/13/13 and med refilled until then, letter mailed to pt per pt request with appt date

## 2013-03-13 ENCOUNTER — Encounter: Payer: Self-pay | Admitting: Gastroenterology

## 2013-03-13 ENCOUNTER — Ambulatory Visit (INDEPENDENT_AMBULATORY_CARE_PROVIDER_SITE_OTHER): Payer: Federal, State, Local not specified - PPO | Admitting: Family Medicine

## 2013-03-13 ENCOUNTER — Encounter: Payer: Self-pay | Admitting: Family Medicine

## 2013-03-13 VITALS — BP 110/64 | HR 57 | Temp 98.4°F | Ht 70.0 in | Wt 171.2 lb

## 2013-03-13 DIAGNOSIS — Z0000 Encounter for general adult medical examination without abnormal findings: Secondary | ICD-10-CM

## 2013-03-13 DIAGNOSIS — Z1211 Encounter for screening for malignant neoplasm of colon: Secondary | ICD-10-CM | POA: Insufficient documentation

## 2013-03-13 DIAGNOSIS — E785 Hyperlipidemia, unspecified: Secondary | ICD-10-CM

## 2013-03-13 LAB — CBC WITH DIFFERENTIAL/PLATELET
Basophils Absolute: 0 10*3/uL (ref 0.0–0.1)
Eosinophils Relative: 2.1 % (ref 0.0–5.0)
HCT: 41.2 % (ref 39.0–52.0)
Hemoglobin: 13.9 g/dL (ref 13.0–17.0)
Lymphs Abs: 1.3 10*3/uL (ref 0.7–4.0)
Monocytes Relative: 7.7 % (ref 3.0–12.0)
Neutro Abs: 3.5 10*3/uL (ref 1.4–7.7)
RDW: 13.3 % (ref 11.5–14.6)

## 2013-03-13 LAB — COMPREHENSIVE METABOLIC PANEL
CO2: 30 mEq/L (ref 19–32)
Creatinine, Ser: 1 mg/dL (ref 0.4–1.5)
GFR: 88.27 mL/min (ref >60.00)
Glucose, Bld: 92 mg/dL (ref 70–99)
Total Bilirubin: 1 mg/dL (ref 0.3–1.2)

## 2013-03-13 LAB — TSH: TSH: 2.62 u[IU]/mL (ref 0.35–5.50)

## 2013-03-13 LAB — LIPID PANEL
HDL: 74.6 mg/dL (ref >39.00)
Total CHOL/HDL Ratio: 3

## 2013-03-13 NOTE — Assessment & Plan Note (Signed)
Ref for screening colonoscopy 

## 2013-03-13 NOTE — Assessment & Plan Note (Signed)
Reviewed health habits including diet and exercise and skin cancer prevention Also reviewed health mt list, fam hx and immunizations  Wellness labs drawn today  Pt has urologist appt today to disc prostate ca screen in light of new family hx

## 2013-03-13 NOTE — Patient Instructions (Addendum)
We will refer you for a screening colonoscopy at check out  Labs now  Take care of yourself Avoid red meat/ fried foods/ egg yolks/ fatty breakfast meats/ butter, cheese and high fat dairy/ and shellfish

## 2013-03-13 NOTE — Progress Notes (Signed)
Subjective:    Patient ID: Adam Ballard, male    DOB: 11-03-1960, 52 y.o.   MRN: [id number]  HPI Here for health maintenance exam and to review chronic medical problems   A lot of family issues at home  Things should be getting better  Learning a lot of new things   Has been feeling pretty good  Has not been able to get out golfing like he wants to  Trying to sell a house-not a lot of time for self care  A lot of physical projects -  Wt is down 8 lb with bmi of 24 More active in the summer - is eating fairly healthy- occ eats out, however  Avoids fried food but does eat some red meat   Colon cancer screen-he is ready to do that now   Flu vaccine- did not get this fall   Td vaccine- was in 2006   Prostate-no issues or symptoms - is going to the urologist after his visit here    Family hx - father has been treated for prostate cancer successfully   Due for labs-will do today   Mood - despite stressors thinks he is doing ok , not depressed  occ takes a unisom for trouble sleeping - not too often   Lab Results  Component Value Date   CHOL 204* 12/30/2008   HDL 55.40 12/30/2008   LDLDIRECT 138.8 12/30/2008   TRIG 53.0 12/30/2008   CHOLHDL 4 12/30/2008     Patient Active Problem List   Diagnosis Date Noted  . Routine general medical examination at a health care facility 03/13/2013  . Tick bite 03/19/2012  . Cerumen impaction 03/02/2012  . STRESS REACTION, ACUTE, WITH EMOTIONAL DISTURBANCE 12/24/2009  . HIP PAIN 12/24/2009  . ABDOMINAL PAIN, LEFT LOWER QUADRANT, HX OF 04/28/2009  . HYPERSOMNIA, ASSOCIATED WITH SLEEP APNEA 07/01/2008  . HYPERLIPIDEMIA 06/27/2008  . ALLERGIC RHINITIS 06/27/2008  . BACK PAIN 05/02/2007   Past Medical History  Diagnosis Date  . History of kidney stones     recur 11/00  . Poison ivy     frequent  . Allergic rhinitis   . Hyperlipidemia   . Low back pain    Past Surgical History  Procedure Laterality Date  . Dre 03/04, 08/06      History  Substance Use Topics  . Smoking status: Former Games developer  . Smokeless tobacco: Not on file  . Alcohol Use: Yes     Comment: occ   Family History  Problem Relation Age of Onset  . Heart disease Father   . Diabetes Father     DM2  . Leukemia Maternal Grandfather   . Heart disease Paternal Grandfather     MI  . Cirrhosis Maternal Aunt     non-alcoholic   No Known Allergies Current Outpatient Prescriptions on File Prior to Visit  Medication Sig Dispense Refill  . cetirizine (ZYRTEC) 10 MG tablet Take 10 mg by mouth as needed.       Marland Kitchen guaiFENesin (MUCINEX) 600 MG 12 hr tablet Take 600 mg by mouth as needed.       Marland Kitchen NASONEX 50 MCG/ACT nasal spray USE 2 SPRAYS IN EACH NOSTRIL ONCE DAILY AS NEEDED  17 g  3   No current facility-administered medications on file prior to visit.    Review of Systems Review of Systems  Constitutional: Negative for fever, appetite change, fatigue and unexpected weight change.  Eyes: Negative for pain and visual disturbance.  Respiratory:  Negative for cough and shortness of breath.   Cardiovascular: Negative for cp or palpitations    Gastrointestinal: Negative for nausea, diarrhea and constipation.  Genitourinary: Negative for urgency and frequency.  Skin: Negative for pallor or rash   Neurological: Negative for weakness, light-headedness, numbness and headaches.  Hematological: Negative for adenopathy. Does not bruise/bleed easily.  Psychiatric/Behavioral: Negative for dysphoric mood. The patient is not nervous/anxious.         Objective:   Physical Exam  Constitutional: He appears well-developed and well-nourished. No distress.  HENT:  Head: Normocephalic and atraumatic.  Right Ear: External ear normal.  Left Ear: External ear normal.  Nose: Nose normal.  Mouth/Throat: Oropharynx is clear and moist.  Eyes: Conjunctivae and EOM are normal. Pupils are equal, round, and reactive to light. Right eye exhibits no discharge. Left eye  exhibits no discharge. No scleral icterus.  Neck: Normal range of motion. Neck supple. No JVD present. Carotid bruit is not present. No thyromegaly present.  Cardiovascular: Normal rate, regular rhythm, normal heart sounds and intact distal pulses.  Exam reveals no gallop.   Pulmonary/Chest: Effort normal and breath sounds normal. No respiratory distress. He has no wheezes. He exhibits no tenderness.  Abdominal: Soft. Bowel sounds are normal. He exhibits no distension, no abdominal bruit and no mass. There is no tenderness.  Musculoskeletal: Normal range of motion. He exhibits no edema and no tenderness.  Lymphadenopathy:    He has no cervical adenopathy.  Neurological: He is alert. He has normal reflexes. No cranial nerve deficit. He exhibits normal muscle tone. Coordination normal.  Skin: Skin is warm and dry. No rash noted. No erythema. No pallor.  Some lentigos Few SK  Psychiatric: He has a normal mood and affect.          Assessment & Plan:

## 2013-03-13 NOTE — Assessment & Plan Note (Signed)
Lipids today Diet is fair Rev low sat fat diet

## 2013-03-15 ENCOUNTER — Encounter: Payer: Self-pay | Admitting: Family Medicine

## 2013-04-30 ENCOUNTER — Ambulatory Visit (AMBULATORY_SURGERY_CENTER): Payer: Self-pay | Admitting: *Deleted

## 2013-04-30 VITALS — Ht 70.0 in | Wt 177.8 lb

## 2013-04-30 DIAGNOSIS — Z1211 Encounter for screening for malignant neoplasm of colon: Secondary | ICD-10-CM

## 2013-04-30 MED ORDER — MOVIPREP 100 G PO SOLR: 1.0000 | Freq: Once | ORAL | Status: DC

## 2013-04-30 NOTE — Progress Notes (Signed)
Denies allergies to eggs or soy products. Denies complications with sedation or anesthesia. 

## 2013-05-01 ENCOUNTER — Encounter: Payer: Self-pay | Admitting: Gastroenterology

## 2013-05-20 ENCOUNTER — Encounter: Payer: Self-pay | Admitting: Gastroenterology

## 2013-05-20 ENCOUNTER — Ambulatory Visit (AMBULATORY_SURGERY_CENTER): Payer: Federal, State, Local not specified - PPO | Admitting: Gastroenterology

## 2013-05-20 VITALS — BP 115/71 | HR 49 | Temp 98.7°F | Resp 15 | Ht 70.0 in | Wt 177.0 lb

## 2013-05-20 DIAGNOSIS — Z1211 Encounter for screening for malignant neoplasm of colon: Secondary | ICD-10-CM

## 2013-05-20 DIAGNOSIS — D126 Benign neoplasm of colon, unspecified: Secondary | ICD-10-CM

## 2013-05-20 DIAGNOSIS — K5730 Diverticulosis of large intestine without perforation or abscess without bleeding: Secondary | ICD-10-CM

## 2013-05-20 MED ORDER — SODIUM CHLORIDE 0.9 % IV SOLN: 500.0000 mL | INTRAVENOUS | Status: DC

## 2013-05-20 NOTE — Progress Notes (Signed)
Patient did not experience any of the following events: a burn prior to discharge; a fall within the facility; wrong site/side/patient/procedure/implant event; or a hospital transfer or hospital admission upon discharge from the facility. (G8907) Patient did not have preoperative order for IV antibiotic SSI prophylaxis. ([id number])  

## 2013-05-20 NOTE — Patient Instructions (Addendum)
Discharge instructions given with verbal understanding. Handouts on polyps and diverticulosis. Resume previous medications. YOU HAD AN ENDOSCOPIC PROCEDURE TODAY AT THE [facility]: Refer to the procedure report that was given to you for any specific questions about what was found during the examination.  If the procedure report does not answer your questions, please call your gastroenterologist to clarify.  If you requested that your care partner not be given the details of your procedure findings, then the procedure report has been included in a sealed envelope for you to review at your convenience later.  YOU SHOULD EXPECT: Some feelings of bloating in the abdomen. Passage of more gas than usual.  Walking can help get rid of the air that was put into your GI tract during the procedure and reduce the bloating. If you had a lower endoscopy (such as a colonoscopy or flexible sigmoidoscopy) you may notice spotting of blood in your stool or on the toilet paper. If you underwent a bowel prep for your procedure, then you may not have a normal bowel movement for a few days.  DIET: Your first meal following the procedure should be a light meal and then it is ok to progress to your normal diet.  A half-sandwich or bowl of soup is an example of a good first meal.  Heavy or fried foods are harder to digest and may make you feel nauseous or bloated.  Likewise meals heavy in dairy and vegetables can cause extra gas to form and this can also increase the bloating.  Drink plenty of fluids but you should avoid alcoholic beverages for 24 hours.  ACTIVITY: Your care partner should take you home directly after the procedure.  You should plan to take it easy, moving slowly for the rest of the day.  You can resume normal activity the day after the procedure however you should NOT DRIVE or use heavy machinery for 24 hours (because of the sedation medicines used during the test).    SYMPTOMS TO REPORT  IMMEDIATELY: A gastroenterologist can be reached at any hour.  During normal business hours, 8:30 AM to 5:00 PM Monday through Friday, call [phone].  After hours and on weekends, please call the GI answering service at [phone] who will take a message and have the physician on call contact you.   Following lower endoscopy (colonoscopy or flexible sigmoidoscopy):  Excessive amounts of blood in the stool  Significant tenderness or worsening of abdominal pains  Swelling of the abdomen that is new, acute  Fever of 100F or higher  FOLLOW UP: If any biopsies were taken you will be contacted by phone or by letter within the next 1-3 weeks.  Call your gastroenterologist if you have not heard about the biopsies in 3 weeks.  Our staff will call the home number listed on your records the next business day following your procedure to check on you and address any questions or concerns that you may have at that time regarding the information given to you following your procedure. This is a courtesy call and so if there is no answer at the home number and we have not heard from you through the emergency physician on call, we will assume that you have returned to your regular daily activities without incident.  SIGNATURES/CONFIDENTIALITY: You and/or your care partner have signed paperwork which will be entered into your electronic medical record.  These signatures attest to the fact that that the information above on your After Visit Summary   has been reviewed and is understood.  Full responsibility of the confidentiality of this discharge information lies with you and/or your care-partner. 

## 2013-05-20 NOTE — Op Note (Signed)
[facility] [address] [place], [address]   COLONOSCOPY PROCEDURE REPORT  PATIENT: Adam Ballard, Adam Ballard  MR#: [id number] BIRTHDATE: Jun 14, 1961 , 51  yrs. old GENDER: Male ENDOSCOPIST: Rachael Fee, MD REFERRED NG:EXBMW Fransisca Connors, M.D. PROCEDURE DATE:  05/20/2013 PROCEDURE:   Colonoscopy with snare polypectomy First Screening Colonoscopy - Avg.  risk and is 50 yrs.  old or older Yes.  Prior Negative Screening - Now for repeat screening. N/A  History of Adenoma - Now for follow-up colonoscopy & has been > or = to 3 yrs.  N/A  Polyps Removed Today? Yes. ASA CLASS:   Class II INDICATIONS:average risk screening. MEDICATIONS: Fentanyl 75 mcg IV, Versed 7 mg IV, and These medications were titrated to patient response per physician's verbal order  DESCRIPTION OF PROCEDURE:   After the risks benefits and alternatives of the procedure were thoroughly explained, informed consent was obtained.  A digital rectal exam revealed no abnormalities of the rectum.   The LB [id number]  endoscope was introduced through the anus and advanced to the cecum, which was identified by both the appendix and ileocecal valve. No adverse events experienced.   The quality of the prep was good.  The instrument was then slowly withdrawn as the colon was fully examined.  COLON FINDINGS: Three polyps were found, removed and sent to pathology.  These were all sessile, located in ascending and descending segments, ranged in size from 2mm to 7mm, all were removed with cold snare.  There were a few small diverticulum in the left colon.  The examination was otherwise normal.  Retroflexed views revealed no abnormalities. The time to cecum=3 minutes 23 seconds.  Withdrawal time=11 minutes 27 seconds.  The scope was withdrawn and the procedure completed. COMPLICATIONS: There were no complications.  ENDOSCOPIC IMPRESSION: Three polyps were found, removed and sent to pathology. There were a few  small diverticulum in the left colon. The examination was otherwise normal.  RECOMMENDATIONS: If the polyp(s) removed today are proven to be adenomatous (pre-cancerous) polyps, you will need a colonoscopy in 3-5 years. Otherwise you should continue to follow colorectal cancer screening guidelines for "routine risk" patients with a colonoscopy in 10 years.  You will receive a letter within 1-2 weeks with the results of your biopsy as well as final recommendations.  Please call my office if you have not received a letter after 3 weeks.   eSigned:  Rachael Fee, MD 05/20/2013 10:54 AM

## 2013-05-21 ENCOUNTER — Telehealth: Payer: Self-pay | Admitting: *Deleted

## 2013-05-21 NOTE — Telephone Encounter (Signed)

## 2013-05-24 ENCOUNTER — Encounter: Payer: Self-pay | Admitting: Gastroenterology

## 2013-06-20 ENCOUNTER — Other Ambulatory Visit: Payer: Self-pay

## 2013-10-07 ENCOUNTER — Telehealth: Payer: Self-pay | Admitting: Pulmonary Disease

## 2013-10-07 NOTE — Telephone Encounter (Signed)
Spoke with pt. He is needing new CPAP. Has not been seen since 09/2010. appt scheduled with RA. Nothing further needed

## 2013-10-17 ENCOUNTER — Ambulatory Visit (INDEPENDENT_AMBULATORY_CARE_PROVIDER_SITE_OTHER): Payer: Federal, State, Local not specified - PPO | Admitting: Pulmonary Disease

## 2013-10-17 ENCOUNTER — Encounter: Payer: Self-pay | Admitting: Pulmonary Disease

## 2013-10-17 VITALS — BP 136/90 | HR 61 | Temp 98.0°F | Ht 70.0 in | Wt 188.0 lb

## 2013-10-17 DIAGNOSIS — G4710 Hypersomnia, unspecified: Secondary | ICD-10-CM

## 2013-10-17 DIAGNOSIS — G4730 Sleep apnea, unspecified: Principal | ICD-10-CM

## 2013-10-17 NOTE — Assessment & Plan Note (Signed)
Rx will be sent for new CPAP 10 cm & download checked on this level in 1 month We will provide you feedback If central apneas noted, may neeed ASV  Weight loss encouraged, compliance with goal of at least 4-6 hrs every night is the expectation. Advised against medications with sedative side effects Cautioned against driving when sleepy - understanding that sleepiness will vary on a day to day basis

## 2013-10-17 NOTE — Patient Instructions (Signed)
Rx will be sent for new CPAP 10 cm & download checked on this level in 1 month We will provide you feedback
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
## 2014-05-26 ENCOUNTER — Telehealth: Payer: Self-pay | Admitting: Family Medicine

## 2014-05-26 NOTE — Telephone Encounter (Signed)
appt scheduled

## 2014-05-26 NOTE — Telephone Encounter (Signed)
Patient Information:  Caller Name: Trever  Phone: [phone]  Patient: Adam Ballard, Adam Ballard  Gender: Male  DOB: 29-Aug-1960  Age: 53 Years  PCP: [facility] [place] ([facility])  Office Follow Up:  Does the office need to follow up with this patient?: No  Instructions For The Office: N/A  RN Note:  instructed to call back 10/13 am for same day appt  Symptoms  Reason For Call & Symptoms: started having a cold with cough 6-8 wks ago; says the cough has lingered; rarely coughs up anything;  has taken allergy meds and OTC cough meds and has not improvement; chest feels tight; gets tired easier than normal;  c/o HA; cough is intermittent; says he wondered if it was allergies, but says he is taking his Fexofenadine and Nasonex; c/o lightheadedness at times where the room spins when he looks up; c/o jitteriness to hands for the past several years, but has become more frequent recently; says they shake like he has drank too much coffee; says he has dropped some things, such as a spoon d/t the problem  Reviewed Health History In EMR: Yes  Reviewed Medications In EMR: Yes  Reviewed Allergies In EMR: Yes  Reviewed Surgeries / Procedures: Yes  Date of Onset of Symptoms: Unknown  Treatments Tried: Dextromethorphan, Guaifenesin  Treatments Tried Worked: No  Guideline(s) Used:  Cough  Disposition Per Guideline:   See Today or Tomorrow in Office  Reason For Disposition Reached:   Patient wants to be seen  Advice Given:  N/A  Patient Will Follow Care Advice:  YES

## 2014-05-26 NOTE — Telephone Encounter (Signed)
Please call and make appt for him this week if he is still sick, thanks

## 2014-05-28 ENCOUNTER — Ambulatory Visit (INDEPENDENT_AMBULATORY_CARE_PROVIDER_SITE_OTHER): Payer: Federal, State, Local not specified - PPO | Admitting: Family Medicine

## 2014-05-28 ENCOUNTER — Encounter: Payer: Self-pay | Admitting: Family Medicine

## 2014-05-28 VITALS — BP 128/78 | HR 64 | Temp 98.2°F | Ht 70.0 in | Wt 183.8 lb

## 2014-05-28 DIAGNOSIS — R251 Tremor, unspecified: Secondary | ICD-10-CM

## 2014-05-28 DIAGNOSIS — H6980 Other specified disorders of Eustachian tube, unspecified ear: Secondary | ICD-10-CM | POA: Insufficient documentation

## 2014-05-28 DIAGNOSIS — H6983 Other specified disorders of Eustachian tube, bilateral: Secondary | ICD-10-CM

## 2014-05-28 DIAGNOSIS — J302 Other seasonal allergic rhinitis: Secondary | ICD-10-CM

## 2014-05-28 MED ORDER — PREDNISONE 10 MG PO TABS: ORAL_TABLET | ORAL | Status: DC

## 2014-05-28 NOTE — Progress Notes (Signed)
Subjective:    Patient ID: Adam Ballard, male    DOB: 1960/08/24, 53 y.o.   MRN: [id number]  HPI Here with uri symptoms   He started with a head cold in September (when traveling in [facility])- with his wife-they both had it  Allergies are bad as well   Very congested in ears- sometimes they hurt -on and off  Lots of drainage in throat - no ST Congestion chest - feels like "there is a spider" in his chest- irritated - coughs a lot - very little production  Dizzy - esp with position change   Was on mucinex -not for the past 2 d Was on zyrtec  Was on nasonex  Held everything the past 2 d   Also he has had some tremor  In hands - mild/ when doing close work  Some caffeine  1 cup of coffee per day  Does not think he is nervous No stiffness or trouble walking   Patient Active Problem List   Diagnosis Date Noted  . Tremor 05/29/2014  . ETD (eustachian tube dysfunction) 05/28/2014  . Routine general medical examination at a health care facility 03/13/2013  . Colon cancer screening 03/13/2013  . HYPERSOMNIA, ASSOCIATED WITH SLEEP APNEA 07/01/2008  . HYPERLIPIDEMIA 06/27/2008  . Allergic rhinitis 06/27/2008   Past Medical History  Diagnosis Date  . History of kidney stones     recur 11/00  . Poison ivy     frequent  . Allergic rhinitis   . Hyperlipidemia   . Low back pain   . Sleep apnea     uses cpap   Past Surgical History  Procedure Laterality Date  . Dre 03/04, 08/06    . Back surgery  06/01/2011    L3-L4 fusion  . Arthroscopic repair acl      twice repaired  . Lithotripsy    . Trigger finger release Left    History  Substance Use Topics  . Smoking status: Never Smoker   . Smokeless tobacco: Never Used     Comment: Pt tried ciggarettes several years ago, never habitually smoked.   . Alcohol Use: 8.4 oz/week    14 Glasses of wine per week     Comment: 2-3 glasses 3 times/weeks   Family History  Problem Relation Age of Onset  . Heart disease Father   .  Diabetes Father     DM2  . Prostate cancer Father   . Leukemia Maternal Grandfather   . Heart disease Paternal Grandfather     MI  . Cirrhosis Maternal Aunt     non-alcoholic  . Colon cancer Neg Hx   . Esophageal cancer Neg Hx   . Rectal cancer Neg Hx   . Stomach cancer Neg Hx    No Known Allergies Current Outpatient Prescriptions on File Prior to Visit  Medication Sig Dispense Refill  . acetaminophen (TYLENOL) 325 MG tablet Take 650 mg by mouth as needed for pain.      . cetirizine (ZYRTEC) 10 MG tablet Take 10 mg by mouth as needed.       Marland Kitchen guaiFENesin (MUCINEX) 600 MG 12 hr tablet Take 600 mg by mouth as needed.       Marland Kitchen NASONEX 50 MCG/ACT nasal spray USE 2 SPRAYS IN EACH NOSTRIL ONCE DAILY AS NEEDED  17 g  3   No current facility-administered medications on file prior to visit.    Review of Systems Review of Systems  Constitutional: Negative for fever,  appetite change, fatigue and unexpected weight change.  ENt pos for ear pressure/discomfort/ nasal cong/ neg for sinus pain or purulent d/c, pos for positional dizziness  Eyes: Negative for pain and visual disturbance.  Respiratory: Negative for cough and shortness of breath.   Cardiovascular: Negative for cp or palpitations    Gastrointestinal: Negative for nausea, diarrhea and constipation.  Genitourinary: Negative for urgency and frequency.  Skin: Negative for pallor or rash   Neurological: Negative for weakness, numbness and headaches. pos for mild tremor  Hematological: Negative for adenopathy. Does not bruise/bleed easily.  Psychiatric/Behavioral: Negative for dysphoric mood. The patient is not nervous/anxious.         Objective:   Physical Exam  Constitutional: He appears well-developed and well-nourished. No distress.  HENT:  Head: Normocephalic and atraumatic.  Right Ear: External ear normal.  Left Ear: External ear normal.  Mouth/Throat: Oropharynx is clear and moist.  Nares are injected and congested   (mild) Clear rhinorrhea R TM is retracted   No sinus tenderness   Eyes: Conjunctivae and EOM are normal. Pupils are equal, round, and reactive to light. Right eye exhibits no discharge. Left eye exhibits no discharge. No scleral icterus.  1-2 beats of horizontal nystagmus bilaterally  Neck: Normal range of motion. Neck supple. No JVD present. Carotid bruit is not present. No thyromegaly present.  Cardiovascular: Normal rate, regular rhythm, normal heart sounds and intact distal pulses.  Exam reveals no gallop.   Pulmonary/Chest: Effort normal and breath sounds normal. No respiratory distress. He has no wheezes. He exhibits no tenderness.  Abdominal: Soft. Bowel sounds are normal. He exhibits no distension, no abdominal bruit and no mass. There is no tenderness.  Musculoskeletal: He exhibits no edema and no tenderness.  Lymphadenopathy:    He has no cervical adenopathy.  Neurological: He is alert. He has normal reflexes. He displays tremor. He displays no atrophy. No cranial nerve deficit or sensory deficit. He exhibits normal muscle tone. Coordination and gait normal.  Very slt hand tremor bilaterally -noted when holding paper   Skin: Skin is warm and dry. No rash noted. No erythema. No pallor.  Psychiatric: He has a normal mood and affect.          Assessment & Plan:   Problem List Items Addressed This Visit     Respiratory   Allergic rhinitis     With ETD and dizziness/ ear discomfort Will resume zyrtec and steroid ns mucinex if helpful Add pred taper 30 mg - disc side eff in detail  Will update if no imp       Nervous and Auditory   ETD (eustachian tube dysfunction) - Primary     See assessment for all rhinitis  Trial of pred taper 30 mg  Update if no imp       Other   Tremor     Pt mentions this at end of visit - notices a jitteriness in hands at times Not pill rolling No stiffness or bradykinesia Not anxious  Disc types of tremor - poss essential/ familial Will  continue to watch -consider further eval if it progresses

## 2014-05-28 NOTE — Progress Notes (Signed)
Pre visit review using our clinic review tool, if applicable. No additional management support is needed unless otherwise documented below in the visit note. 

## 2014-05-28 NOTE — Patient Instructions (Signed)
Start back on zyrtec Start back on nasal spray  Take mucinex if you feel congested  Take prednisone taper 30 mg  Let me know if you do not improve after finishing this

## 2014-05-29 DIAGNOSIS — R251 Tremor, unspecified: Secondary | ICD-10-CM | POA: Insufficient documentation

## 2014-05-29 NOTE — Assessment & Plan Note (Signed)
With ETD and dizziness/ ear discomfort Will resume zyrtec and steroid ns mucinex if helpful Add pred taper 30 mg - disc side eff in detail  Will update if no imp

## 2014-05-29 NOTE — Assessment & Plan Note (Signed)
See assessment for all rhinitis  Trial of pred taper 30 mg  Update if no imp

## 2014-05-29 NOTE — Assessment & Plan Note (Signed)
Pt mentions this at end of visit - notices a jitteriness in hands at times Not pill rolling No stiffness or bradykinesia Not anxious  Disc types of tremor - poss essential/ familial Will continue to watch -consider further eval if it progresses

## 2015-02-11 ENCOUNTER — Encounter: Payer: Self-pay | Admitting: Pulmonary Disease

## 2015-02-11 ENCOUNTER — Ambulatory Visit (INDEPENDENT_AMBULATORY_CARE_PROVIDER_SITE_OTHER): Payer: Federal, State, Local not specified - PPO | Admitting: Pulmonary Disease

## 2015-02-11 VITALS — BP 126/82 | HR 50 | Ht 70.0 in | Wt 178.2 lb

## 2015-02-11 DIAGNOSIS — G4730 Sleep apnea, unspecified: Secondary | ICD-10-CM

## 2015-02-11 DIAGNOSIS — G4710 Hypersomnia, unspecified: Secondary | ICD-10-CM

## 2015-02-11 NOTE — Progress Notes (Signed)
   Subjective:    Patient ID: Adam Ballard, male    DOB: 10/08/1960, 54 y.o.   MRN: [id number]  HPI  53/M to re-establish for obstructive/ complex sleep apnea PSG-07/2008 >> severe obstructive sleep apnea , ahi 30/h, corrected by CPAP 10 cm medium full face mask. Central apneas emerged early on CPAP raising the possibility of complex sleep apnea. He has responded well to CPAP therapy.  Good compliance on multiple downloads  8/9-09/19/10 >> download on 12 cm >> good compliance > 6h avg, no residuals 2014 - download on 12 cm- AHI 4.5/h, good usage   02/11/2015  Chief Complaint  Patient presents with  . Sleep Apnea    CPAP not working as well, thinks he needs to adjust pressure.   Got new CPAP 10/2013  Bedtime around 11:30 PM, sleep latency is minimal, he sleeps on his side with one pillow, no nocturnal awakenings and is out of bed by 6:30 AM feeling refreshed when he uses his CPAP without dryness of mouth. He has used a full face mask due to being a mouth breather. He feels not enough pressure - Could not get download today ? Modem issue   Review of Systems  neg for any significant sore throat, dysphagia, itching, sneezing, nasal congestion or excess/ purulent secretions, fever, chills, sweats, unintended wt loss, pleuritic or exertional cp, hempoptysis, orthopnea pnd or change in chronic leg swelling. Also denies presyncope, palpitations, heartburn, abdominal pain, nausea, vomiting, diarrhea or change in bowel or urinary habits, dysuria,hematuria, rash, arthralgias, visual complaints, headache, numbness weakness or ataxia.     Objective:   Physical Exam  Gen. Pleasant, well-nourished, in no distress ENT - no lesions, no post nasal drip Neck: No JVD, no thyromegaly, no carotid bruits Lungs: no use of accessory muscles, no dullness to percussion, clear without rales or rhonchi  Cardiovascular: Rhythm regular, heart sounds  normal, no murmurs or gallops, no peripheral  edema Musculoskeletal: No deformities, no cyanosis or clubbing         Assessment & Plan:

## 2015-02-11 NOTE — Patient Instructions (Signed)
We will review download & make changes to pressure as needed

## 2015-02-11 NOTE — Assessment & Plan Note (Signed)
He will take in machine to check modem After download review, will decide if pr needs to be increased Central apneas emerge on higher pressure  Weight loss encouraged, compliance with goal of at least 4-6 hrs every night is the expectation. Advised against medications with sedative side effects Cautioned against driving when sleepy - understanding that sleepiness will vary on a day to day basis

## 2016-01-29 ENCOUNTER — Telehealth: Payer: Self-pay | Admitting: Family Medicine

## 2016-01-29 ENCOUNTER — Other Ambulatory Visit (INDEPENDENT_AMBULATORY_CARE_PROVIDER_SITE_OTHER): Payer: Federal, State, Local not specified - PPO

## 2016-01-29 DIAGNOSIS — Z0000 Encounter for general adult medical examination without abnormal findings: Secondary | ICD-10-CM

## 2016-01-29 DIAGNOSIS — Z125 Encounter for screening for malignant neoplasm of prostate: Secondary | ICD-10-CM | POA: Insufficient documentation

## 2016-01-29 LAB — CBC WITH DIFFERENTIAL/PLATELET
BASOS PCT: 0.7 % (ref 0.0–3.0)
Basophils Absolute: 0 10*3/uL (ref 0.0–0.1)
EOS ABS: 0.2 10*3/uL (ref 0.0–0.7)
Eosinophils Relative: 2.7 % (ref 0.0–5.0)
HEMATOCRIT: 41 % (ref 39.0–52.0)
Hemoglobin: 13.8 g/dL (ref 13.0–17.0)
LYMPHS PCT: 30.8 % (ref 12.0–46.0)
Lymphs Abs: 1.8 10*3/uL (ref 0.7–4.0)
MCHC: 33.7 g/dL (ref 30.0–36.0)
MCV: 87.9 fl (ref 78.0–100.0)
MONO ABS: 0.4 10*3/uL (ref 0.1–1.0)
Monocytes Relative: 6.1 % (ref 3.0–12.0)
Neutro Abs: 3.5 10*3/uL (ref 1.4–7.7)
Neutrophils Relative %: 59.7 % (ref 43.0–77.0)
Platelets: 266 10*3/uL (ref 150.0–400.0)
RBC: 4.66 Mil/uL (ref 4.22–5.81)
RDW: 13.2 % (ref 11.5–15.5)
WBC: 5.9 10*3/uL (ref 4.0–10.5)

## 2016-01-29 LAB — COMPREHENSIVE METABOLIC PANEL
ALT: 16 U/L (ref 0–53)
AST: 16 U/L (ref 0–37)
Albumin: 4.6 g/dL (ref 3.5–5.2)
Alkaline Phosphatase: 47 U/L (ref 39–117)
BILIRUBIN TOTAL: 0.5 mg/dL (ref 0.2–1.2)
BUN: 17 mg/dL (ref 6–23)
CALCIUM: 9.7 mg/dL (ref 8.4–10.5)
CHLORIDE: 102 meq/L (ref 96–112)
CO2: 31 meq/L (ref 19–32)
Creatinine, Ser: 1.05 mg/dL (ref 0.40–1.50)
GFR: 77.8 mL/min (ref >60.00)
GLUCOSE: 95 mg/dL (ref 70–99)
Potassium: 3.9 mEq/L (ref 3.5–5.1)
Sodium: 139 mEq/L (ref 135–145)
Total Protein: 6.7 g/dL (ref 6.0–8.3)

## 2016-01-29 LAB — LIPID PANEL
CHOLESTEROL: 247 mg/dL — AB (ref 0–200)
HDL: 70.5 mg/dL (ref >39.00)
LDL Cholesterol: 167 mg/dL — ABNORMAL HIGH (ref 0–99)
NONHDL: 176.99
Total CHOL/HDL Ratio: 4
Triglycerides: 52 mg/dL (ref 0.0–149.0)
VLDL: 10.4 mg/dL (ref 0.0–40.0)

## 2016-01-29 LAB — PSA: PSA: 0.43 ng/mL (ref 0.10–4.00)

## 2016-01-29 LAB — TSH: TSH: 5.39 u[IU]/mL — AB (ref 0.35–4.50)

## 2016-01-29 NOTE — Telephone Encounter (Signed)
-----   Message from Ellamae Sia sent at 01/29/2016  8:12 AM EDT ----- Regarding: Lab orders for now Patient is scheduled for CPX labs, please order future labs, Thanks , Terri   Not sure if you are on today, I will ask DrG if no orders by noon

## 2016-02-03 ENCOUNTER — Encounter: Payer: Self-pay | Admitting: Family Medicine

## 2016-02-03 ENCOUNTER — Ambulatory Visit (INDEPENDENT_AMBULATORY_CARE_PROVIDER_SITE_OTHER): Payer: Federal, State, Local not specified - PPO | Admitting: Family Medicine

## 2016-02-03 VITALS — BP 130/80 | HR 62 | Temp 98.2°F | Ht 70.0 in | Wt 178.8 lb

## 2016-02-03 DIAGNOSIS — Z23 Encounter for immunization: Secondary | ICD-10-CM | POA: Diagnosis not present

## 2016-02-03 DIAGNOSIS — Z0000 Encounter for general adult medical examination without abnormal findings: Secondary | ICD-10-CM | POA: Diagnosis not present

## 2016-02-03 DIAGNOSIS — Z1211 Encounter for screening for malignant neoplasm of colon: Secondary | ICD-10-CM | POA: Diagnosis not present

## 2016-02-03 DIAGNOSIS — E785 Hyperlipidemia, unspecified: Secondary | ICD-10-CM | POA: Diagnosis not present

## 2016-02-03 DIAGNOSIS — E039 Hypothyroidism, unspecified: Secondary | ICD-10-CM | POA: Insufficient documentation

## 2016-02-03 DIAGNOSIS — Z125 Encounter for screening for malignant neoplasm of prostate: Secondary | ICD-10-CM | POA: Diagnosis not present

## 2016-02-03 DIAGNOSIS — R7989 Other specified abnormal findings of blood chemistry: Secondary | ICD-10-CM

## 2016-02-03 DIAGNOSIS — E038 Other specified hypothyroidism: Secondary | ICD-10-CM | POA: Insufficient documentation

## 2016-02-03 NOTE — Progress Notes (Signed)
Pre visit review using our clinic review tool, if applicable. No additional management support is needed unless otherwise documented below in the visit note. 

## 2016-02-03 NOTE — Patient Instructions (Addendum)
Tdap vaccine today  You are due for a colonoscopy 10/17 for re check of polyps  See the dermatologist as planned Start using some more sun protection  Keep walking  Follow up with urology as planned/ PSA number is good at 0.43 Your cholesterol is high (Avoid red meat/ fried foods/ egg yolks/ fatty breakfast meats/ butter, cheese and high fat dairy/ and shellfish)  We will want to keep an eye on that  Make a fasting lab appt in 6 months for cholesterol and thyroid

## 2016-02-03 NOTE — Progress Notes (Signed)
Subjective:    Patient ID: Adam Ballard, male    DOB: 1960-12-22, 55 y.o.   MRN: [id number]  HPI  Here for health maintenance exam and to review chronic medical problems    Wt Readings from Last 3 Encounters:  02/03/16 178 lb 12.8 oz (81.103 kg)  02/11/15 178 lb 3.2 oz (80.831 kg)  05/28/14 183 lb 12 oz (83.348 kg)    Caring for his wife with ovarian cancer - has gone through a lot  Wants to make sure he is healthy Not a lot of time to take care of himself   Has dermatology appt tomorrow - mole is weird - wants to make sure it is ok (in genital area)  Hep C and HIV screening - not high risk/ declines   Td was 8/06-will get that today   Colonoscopy 10/14- 3 tubular adenomas  Has a 3 year recall -due this fall /aware  Prostate cancer screening - is due for his annual urology visit for kidney stones as well as  Screening  Still passes small kidney stones and tolerates them pretty well  Does try to drink lemonade  Father had prostate cancer  Lab Results  Component Value Date   PSA 0.43 01/29/2016   no trouble emptying bladder Stream has always been slow -not new  No nocturia    Hx of hyperlipidemia Lab Results  Component Value Date   CHOL 247* 01/29/2016   CHOL 200 03/13/2013   CHOL 204* 12/30/2008   Lab Results  Component Value Date   HDL 70.50 01/29/2016   HDL 74.60 03/13/2013   HDL 55.40 12/30/2008   Lab Results  Component Value Date   LDLCALC 167* 01/29/2016   LDLCALC 117* 03/13/2013   Lab Results  Component Value Date   TRIG 52.0 01/29/2016   TRIG 41.0 03/13/2013   TRIG 53.0 12/30/2008   Lab Results  Component Value Date   CHOLHDL 4 01/29/2016   CHOLHDL 3 03/13/2013   CHOLHDL 4 12/30/2008   Lab Results  Component Value Date   LDLDIRECT 138.8 12/30/2008   LDLDIRECT 142.6 06/27/2008  LDL is up significantly  Is up due to diet - eating high fat diet incl ice cream and cheese cake (mostly desserts)  Has not been a priority   Has not had  any etoh lately    bp higher than usual -poss stress related   Thinks he is emotionally doing ok - but this is tough  Declines counseling at this time    BP Readings from Last 3 Encounters:  02/03/16 136/88  02/11/15 126/82  05/28/14 128/78  re check BP: 130/80 mmHg     Results for orders placed or performed in visit on 01/29/16  CBC with Differential/Platelet  Result Value Ref Range   WBC 5.9 4.0 - 10.5 K/uL   RBC 4.66 4.22 - 5.81 Mil/uL   Hemoglobin 13.8 13.0 - 17.0 g/dL   HCT 41.0 39.0 - 52.0 %   MCV 87.9 78.0 - 100.0 fl   MCHC 33.7 30.0 - 36.0 g/dL   RDW 13.2 11.5 - 15.5 %   Platelets 266.0 150.0 - 400.0 K/uL   Neutrophils Relative % 59.7 43.0 - 77.0 %   Lymphocytes Relative 30.8 12.0 - 46.0 %   Monocytes Relative 6.1 3.0 - 12.0 %   Eosinophils Relative 2.7 0.0 - 5.0 %   Basophils Relative 0.7 0.0 - 3.0 %   Neutro Abs 3.5 1.4 - 7.7 K/uL   Lymphs Abs  1.8 0.7 - 4.0 K/uL   Monocytes Absolute 0.4 0.1 - 1.0 K/uL   Eosinophils Absolute 0.2 0.0 - 0.7 K/uL   Basophils Absolute 0.0 0.0 - 0.1 K/uL  Comprehensive metabolic panel  Result Value Ref Range   Sodium 139 135 - 145 mEq/L   Potassium 3.9 3.5 - 5.1 mEq/L   Chloride 102 96 - 112 mEq/L   CO2 31 19 - 32 mEq/L   Glucose, Bld 95 70 - 99 mg/dL   BUN 17 6 - 23 mg/dL   Creatinine, Ser 1.05 0.40 - 1.50 mg/dL   Total Bilirubin 0.5 0.2 - 1.2 mg/dL   Alkaline Phosphatase 47 39 - 117 U/L   AST 16 0 - 37 U/L   ALT 16 0 - 53 U/L   Total Protein 6.7 6.0 - 8.3 g/dL   Albumin 4.6 3.5 - 5.2 g/dL   Calcium 9.7 8.4 - 10.5 mg/dL   GFR 77.80 >60.00 mL/min  Lipid panel  Result Value Ref Range   Cholesterol 247 (H) 0 - 200 mg/dL   Triglycerides 52.0 0.0 - 149.0 mg/dL   HDL 70.50 >39.00 mg/dL   VLDL 10.4 0.0 - 40.0 mg/dL   LDL Cholesterol 167 (H) 0 - 99 mg/dL   Total CHOL/HDL Ratio 4    NonHDL 176.99   TSH  Result Value Ref Range   TSH 5.39 (H) 0.35 - 4.50 uIU/mL  PSA  Result Value Ref Range   PSA 0.43 0.10 - 4.00 ng/mL     TSH is high-this is new  Some fatigue-but having issues with his cpap   Patient Active Problem List   Diagnosis Date Noted  . Elevated TSH 02/03/2016  . Prostate cancer screening 01/29/2016  . Tremor 05/29/2014  . ETD (eustachian tube dysfunction) 05/28/2014  . Routine general medical examination at a health care facility 03/13/2013  . Colon cancer screening 03/13/2013  . Hypersomnia with sleep apnea 07/01/2008  . Hyperlipidemia 06/27/2008  . Allergic rhinitis 06/27/2008   Past Medical History  Diagnosis Date  . History of kidney stones     recur 11/00  . Poison ivy     frequent  . Allergic rhinitis   . Hyperlipidemia   . Low back pain   . Sleep apnea     uses cpap   Past Surgical History  Procedure Laterality Date  . Dre 03/04, 08/06    . Back surgery  06/01/2011    L3-L4 fusion  . Arthroscopic repair acl      twice repaired  . Lithotripsy    . Trigger finger release Left    Social History  Substance Use Topics  . Smoking status: Never Smoker   . Smokeless tobacco: Never Used     Comment: Pt tried ciggarettes several years ago, never habitually smoked.   . Alcohol Use: 8.4 oz/week    14 Glasses of wine per week     Comment: 2-3 glasses 3 times/weeks   Family History  Problem Relation Age of Onset  . Heart disease Father   . Diabetes Father     DM2  . Prostate cancer Father   . Leukemia Maternal Grandfather   . Heart disease Paternal Grandfather     MI  . Cirrhosis Maternal Aunt     non-alcoholic  . Colon cancer Neg Hx   . Esophageal cancer Neg Hx   . Rectal cancer Neg Hx   . Stomach cancer Neg Hx    No Known Allergies Current Outpatient Prescriptions on  File Prior to Visit  Medication Sig Dispense Refill  . acetaminophen (TYLENOL) 325 MG tablet Take 650 mg by mouth as needed for pain.    . cetirizine (ZYRTEC) 10 MG tablet Take 10 mg by mouth as needed.     Marland Kitchen guaiFENesin (MUCINEX) 600 MG 12 hr tablet Take 600 mg by mouth as needed.     Marland Kitchen NASONEX  50 MCG/ACT nasal spray USE 2 SPRAYS IN EACH NOSTRIL ONCE DAILY AS NEEDED 17 g 3   No current facility-administered medications on file prior to visit.     Review of Systems Review of Systems  Constitutional: Negative for fever, appetite change,  and unexpected weight change. pos for fatigue from problems with cpap Eyes: Negative for pain and visual disturbance.  Respiratory: Negative for cough and shortness of breath.   Cardiovascular: Negative for cp or palpitations    Gastrointestinal: Negative for nausea, diarrhea and constipation.  Genitourinary: Negative for urgency and frequency.  Skin: Negative for pallor or rash   Neurological: Negative for weakness, light-headedness, numbness and headaches.  Hematological: Negative for adenopathy. Does not bruise/bleed easily.  Psychiatric/Behavioral: Negative for dysphoric mood. The patient is not nervous/anxious.         Objective:   Physical Exam  Constitutional: He appears well-developed and well-nourished. No distress.  Well appearing   HENT:  Head: Normocephalic and atraumatic.  Right Ear: External ear normal.  Left Ear: External ear normal.  Nose: Nose normal.  Mouth/Throat: Oropharynx is clear and moist.  Nares are boggy but not congested   Eyes: Conjunctivae and EOM are normal. Pupils are equal, round, and reactive to light. Right eye exhibits no discharge. Left eye exhibits no discharge. No scleral icterus.  Neck: Normal range of motion. Neck supple. No JVD present. Carotid bruit is not present. No thyromegaly present.  Cardiovascular: Normal rate, regular rhythm, normal heart sounds and intact distal pulses.  Exam reveals no gallop.   Pulmonary/Chest: Effort normal and breath sounds normal. No respiratory distress. He has no wheezes. He exhibits no tenderness.  Abdominal: Soft. Bowel sounds are normal. He exhibits no distension, no abdominal bruit and no mass. There is no tenderness.  Musculoskeletal: He exhibits no edema or  tenderness.  Lymphadenopathy:    He has no cervical adenopathy.  Neurological: He is alert. He has normal reflexes. No cranial nerve deficit. He exhibits normal muscle tone. Coordination normal.  Skin: Skin is warm and dry. No rash noted. No erythema. No pallor.  Solar aging and lentigines diffusely  Psychiatric: He has a normal mood and affect.          Assessment & Plan:   Problem List Items Addressed This Visit      Other   Routine general medical examination at a health care facility    Reviewed health habits including diet and exercise and skin cancer prevention Reviewed appropriate screening tests for age  Also reviewed health mt list, fam hx and immunization status , as well as social and family history   See HPI Labs reviewed Tdap vaccine today  You are due for a colonoscopy 10/17 for re check of polyps  See the dermatologist as planned Start using some more sun protection  Keep walking  Follow up with urology as planned/ PSA number is good at 0.43 Your cholesterol is high (Avoid red meat/ fried foods/ egg yolks/ fatty breakfast meats/ butter, cheese and high fat dairy/ and shellfish)  We will want to keep an eye on that  Make  a fasting lab appt in 6 months for cholesterol and thyroid       Prostate cancer screening    Lab Results  Component Value Date   PSA 0.43 01/29/2016    He has plan for yearly urology visit -will get exam there Has fam hx of prostate cancer No complaints/symptoms       Hyperlipidemia    Disc goals for lipids and reasons to control them Rev labs with pt Rev low sat fat diet in detail LDL is up  Will start to watch diet / handout given Re check 6 mo      Relevant Orders   Lipid panel   Elevated TSH    Very mild and no clinical changes Will re check 6 mo with free T4       Relevant Orders   TSH   T4, Free   Colon cancer screening - Primary    Due for recall in Oct - pt will call to schedule if needed at that time  Hx of  adenomatous polyps        Other Visit Diagnoses    Need for Tdap vaccination        Relevant Orders    Tdap vaccine greater than or equal to 7yo IM (Completed)

## 2016-02-04 DIAGNOSIS — D485 Neoplasm of uncertain behavior of skin: Secondary | ICD-10-CM | POA: Diagnosis not present

## 2016-02-04 NOTE — Assessment & Plan Note (Signed)
Lab Results  Component Value Date   PSA 0.43 01/29/2016    He has plan for yearly urology visit -will get exam there Has fam hx of prostate cancer No complaints/symptoms

## 2016-02-04 NOTE — Assessment & Plan Note (Signed)
Reviewed health habits including diet and exercise and skin cancer prevention Reviewed appropriate screening tests for age  Also reviewed health mt list, fam hx and immunization status , as well as social and family history   See HPI Labs reviewed Tdap vaccine today  You are due for a colonoscopy 10/17 for re check of polyps  See the dermatologist as planned Start using some more sun protection  Keep walking  Follow up with urology as planned/ PSA number is good at 0.43 Your cholesterol is high (Avoid red meat/ fried foods/ egg yolks/ fatty breakfast meats/ butter, cheese and high fat dairy/ and shellfish)  We will want to keep an eye on that  Make a fasting lab appt in 6 months for cholesterol and thyroid

## 2016-02-04 NOTE — Assessment & Plan Note (Signed)
Due for recall in Oct - pt will call to schedule if needed at that time  Hx of adenomatous polyps

## 2016-02-04 NOTE — Assessment & Plan Note (Signed)
Disc goals for lipids and reasons to control them Rev labs with pt Rev low sat fat diet in detail LDL is up  Will start to watch diet / handout given Re check 6 mo

## 2016-02-04 NOTE — Assessment & Plan Note (Signed)
Very mild and no clinical changes Will re check 6 mo with free T4

## 2016-03-09 ENCOUNTER — Ambulatory Visit (INDEPENDENT_AMBULATORY_CARE_PROVIDER_SITE_OTHER): Payer: Federal, State, Local not specified - PPO | Admitting: Pulmonary Disease

## 2016-03-09 ENCOUNTER — Encounter: Payer: Self-pay | Admitting: Pulmonary Disease

## 2016-03-09 DIAGNOSIS — G4730 Sleep apnea, unspecified: Secondary | ICD-10-CM | POA: Diagnosis not present

## 2016-03-09 DIAGNOSIS — R946 Abnormal results of thyroid function studies: Secondary | ICD-10-CM

## 2016-03-09 DIAGNOSIS — G4710 Hypersomnia, unspecified: Secondary | ICD-10-CM | POA: Diagnosis not present

## 2016-03-09 DIAGNOSIS — R7989 Other specified abnormal findings of blood chemistry: Secondary | ICD-10-CM

## 2016-03-09 NOTE — Assessment & Plan Note (Signed)
We will assess download and increased pressure as needed He is on 10 cm and we can check download again to look for treatment emergent apneas  Weight loss encouraged, compliance with goal of at least 4-6 hrs every night is the expectation. Advised against medications with sedative side effects Cautioned against driving when sleepy - understanding that sleepiness will vary on a day to day basis

## 2016-03-09 NOTE — Progress Notes (Signed)
   Subjective:    Patient ID: Adam Ballard, male    DOB: 24-May-1961, 55 y.o.   MRN: [id number]  HPI  55/M to re-establish for obstructive/ complex sleep apnea  PSG-07/2008 >> severe obstructive sleep apnea , ahi 30/h, corrected by CPAP 10 cm medium full face mask. Central apneas emerged early on CPAP . He has responded well to CPAP therapy.  Good compliance on multiple downloads    03/09/2016  Chief Complaint  Patient presents with  . Follow-up    wears CPAP every night, no data showed up on [employer], shows that he has wireless, but no data.  does not like the new CPAP machine, feels like his pressure is too low.      Got new CPAP 10/2013 No problems with fullface mask He feels like he is not getting enough pressure with his new CPAP machine and wonders if we could increase  No dryness Occasional sleepiness in the afternoons, feels tired all the TSH was noted to be high on blood work in 01/2016   8/9-09/19/10 >>54m  download on 12 cm >> good compliance > 6h avg, no residuals 2014 - download on 12 cm- AHI 4.5/h, good usage      Review of Systems Patient denies significant dyspnea,cough, hemoptysis,  chest pain, palpitations, pedal edema, orthopnea, paroxysmal nocturnal dyspnea, lightheadedness, nausea, vomiting, abdominal or  leg pains      Objective:   Physical Exam  Gen. Pleasant, well-nourished, in no distress ENT - no lesions, no post nasal drip Neck: No JVD, no thyromegaly, no carotid bruits Lungs: no use of accessory muscles, no dullness to percussion, clear without rales or rhonchi  Cardiovascular: Rhythm regular, heart sounds  normal, no murmurs or gallops, no peripheral edema Musculoskeletal: No deformities, no cyanosis or clubbing        Assessment & Plan:

## 2016-03-09 NOTE — Patient Instructions (Signed)
We will assess download and increased pressure as needed

## 2016-03-09 NOTE — Assessment & Plan Note (Signed)
-  could also be a cause of sleepiness and tiredness Recheck TSH in 6 months

## 2016-03-14 ENCOUNTER — Encounter: Payer: Self-pay | Admitting: Gastroenterology

## 2016-03-15 ENCOUNTER — Telehealth: Payer: Self-pay | Admitting: Pulmonary Disease

## 2016-03-15 DIAGNOSIS — G4733 Obstructive sleep apnea (adult) (pediatric): Secondary | ICD-10-CM

## 2016-03-15 DIAGNOSIS — Z9989 Dependence on other enabling machines and devices: Principal | ICD-10-CM

## 2016-03-15 NOTE — Telephone Encounter (Signed)
Per Dr. Elsworth Soho, Residuals 10/hr on CPAP 10, change setting to 12 and recheck download x 1 month.  Order entered for change in pressure setting and download.  Patient aware. Nothing further needed.

## 2016-03-17 DIAGNOSIS — G4733 Obstructive sleep apnea (adult) (pediatric): Secondary | ICD-10-CM | POA: Diagnosis not present

## 2016-03-18 DIAGNOSIS — G4733 Obstructive sleep apnea (adult) (pediatric): Secondary | ICD-10-CM | POA: Diagnosis not present

## 2016-03-24 ENCOUNTER — Encounter: Payer: Self-pay | Admitting: Pulmonary Disease

## 2016-04-27 DIAGNOSIS — N200 Calculus of kidney: Secondary | ICD-10-CM | POA: Diagnosis not present

## 2016-04-27 DIAGNOSIS — N400 Enlarged prostate without lower urinary tract symptoms: Secondary | ICD-10-CM | POA: Diagnosis not present

## 2016-08-04 ENCOUNTER — Other Ambulatory Visit (INDEPENDENT_AMBULATORY_CARE_PROVIDER_SITE_OTHER): Payer: Federal, State, Local not specified - PPO

## 2016-08-04 DIAGNOSIS — E784 Other hyperlipidemia: Secondary | ICD-10-CM | POA: Diagnosis not present

## 2016-08-04 DIAGNOSIS — R946 Abnormal results of thyroid function studies: Secondary | ICD-10-CM | POA: Diagnosis not present

## 2016-08-04 DIAGNOSIS — E785 Hyperlipidemia, unspecified: Secondary | ICD-10-CM | POA: Diagnosis not present

## 2016-08-04 DIAGNOSIS — E7849 Other hyperlipidemia: Secondary | ICD-10-CM

## 2016-08-04 DIAGNOSIS — R7989 Other specified abnormal findings of blood chemistry: Secondary | ICD-10-CM

## 2016-08-04 LAB — LIPID PANEL
CHOL/HDL RATIO: 3
Cholesterol: 252 mg/dL — ABNORMAL HIGH (ref 0–200)
HDL: 83.4 mg/dL (ref >39.00)
LDL Cholesterol: 155 mg/dL — ABNORMAL HIGH (ref 0–99)
NONHDL: 169.06
Triglycerides: 70 mg/dL (ref 0.0–149.0)
VLDL: 14 mg/dL (ref 0.0–40.0)

## 2016-08-04 LAB — T4, FREE: FREE T4: 0.72 ng/dL (ref 0.60–1.60)

## 2016-08-04 LAB — TSH: TSH: 4.52 u[IU]/mL — ABNORMAL HIGH (ref 0.35–4.50)

## 2017-01-16 ENCOUNTER — Other Ambulatory Visit: Payer: Federal, State, Local not specified - PPO

## 2017-01-16 ENCOUNTER — Telehealth (INDEPENDENT_AMBULATORY_CARE_PROVIDER_SITE_OTHER): Payer: Federal, State, Local not specified - PPO | Admitting: Family Medicine

## 2017-01-16 DIAGNOSIS — Z0000 Encounter for general adult medical examination without abnormal findings: Secondary | ICD-10-CM | POA: Diagnosis not present

## 2017-01-16 DIAGNOSIS — Z125 Encounter for screening for malignant neoplasm of prostate: Secondary | ICD-10-CM | POA: Diagnosis not present

## 2017-01-16 LAB — COMPREHENSIVE METABOLIC PANEL
ALT: 24 U/L (ref 0–53)
AST: 23 U/L (ref 0–37)
Albumin: 4.6 g/dL (ref 3.5–5.2)
Alkaline Phosphatase: 51 U/L (ref 39–117)
BUN: 17 mg/dL (ref 6–23)
CALCIUM: 9.6 mg/dL (ref 8.4–10.5)
CHLORIDE: 104 meq/L (ref 96–112)
CO2: 32 meq/L (ref 19–32)
CREATININE: 1.08 mg/dL (ref 0.40–1.50)
GFR: 75.04 mL/min (ref >60.00)
Glucose, Bld: 103 mg/dL — ABNORMAL HIGH (ref 70–99)
POTASSIUM: 4.9 meq/L (ref 3.5–5.1)
SODIUM: 140 meq/L (ref 135–145)
Total Bilirubin: 0.6 mg/dL (ref 0.2–1.2)
Total Protein: 6.7 g/dL (ref 6.0–8.3)

## 2017-01-16 LAB — CBC WITH DIFFERENTIAL/PLATELET
BASOS PCT: 0.8 % (ref 0.0–3.0)
Basophils Absolute: 0 10*3/uL (ref 0.0–0.1)
EOS ABS: 0.2 10*3/uL (ref 0.0–0.7)
Eosinophils Relative: 3 % (ref 0.0–5.0)
HCT: 41.9 % (ref 39.0–52.0)
Hemoglobin: 14.2 g/dL (ref 13.0–17.0)
Lymphocytes Relative: 30.5 % (ref 12.0–46.0)
Lymphs Abs: 1.6 10*3/uL (ref 0.7–4.0)
MCHC: 33.8 g/dL (ref 30.0–36.0)
MCV: 88.2 fl (ref 78.0–100.0)
MONO ABS: 0.4 10*3/uL (ref 0.1–1.0)
Monocytes Relative: 7 % (ref 3.0–12.0)
NEUTROS ABS: 3 10*3/uL (ref 1.4–7.7)
Neutrophils Relative %: 58.7 % (ref 43.0–77.0)
PLATELETS: 245 10*3/uL (ref 150.0–400.0)
RBC: 4.75 Mil/uL (ref 4.22–5.81)
RDW: 13.3 % (ref 11.5–15.5)
WBC: 5.1 10*3/uL (ref 4.0–10.5)

## 2017-01-16 LAB — LIPID PANEL
CHOL/HDL RATIO: 3
Cholesterol: 200 mg/dL (ref 0–200)
HDL: 65.1 mg/dL (ref >39.00)
LDL CALC: 125 mg/dL — AB (ref 0–99)
NonHDL: 134.86
TRIGLYCERIDES: 49 mg/dL (ref 0.0–149.0)
VLDL: 9.8 mg/dL (ref 0.0–40.0)

## 2017-01-16 LAB — PSA: PSA: 0.39 ng/mL (ref 0.10–4.00)

## 2017-01-16 LAB — TSH: TSH: 6.9 u[IU]/mL — AB (ref 0.35–4.50)

## 2017-01-16 NOTE — Telephone Encounter (Signed)
-----   Message from Ellamae Sia sent at 01/16/2017  9:16 AM EDT ----- Regarding: lab orders for now Patient is scheduled for CPX labs, please order future labs, Thanks , Karna Christmas

## 2017-01-27 ENCOUNTER — Other Ambulatory Visit: Payer: Federal, State, Local not specified - PPO

## 2017-02-03 ENCOUNTER — Ambulatory Visit (INDEPENDENT_AMBULATORY_CARE_PROVIDER_SITE_OTHER): Payer: Federal, State, Local not specified - PPO | Admitting: Family Medicine

## 2017-02-03 ENCOUNTER — Encounter: Payer: Self-pay | Admitting: Family Medicine

## 2017-02-03 VITALS — BP 130/76 | HR 70 | Temp 98.1°F | Ht 69.5 in | Wt 177.5 lb

## 2017-02-03 DIAGNOSIS — Z1211 Encounter for screening for malignant neoplasm of colon: Secondary | ICD-10-CM | POA: Diagnosis not present

## 2017-02-03 DIAGNOSIS — E7849 Other hyperlipidemia: Secondary | ICD-10-CM

## 2017-02-03 DIAGNOSIS — Z0000 Encounter for general adult medical examination without abnormal findings: Secondary | ICD-10-CM | POA: Diagnosis not present

## 2017-02-03 DIAGNOSIS — E784 Other hyperlipidemia: Secondary | ICD-10-CM | POA: Diagnosis not present

## 2017-02-03 DIAGNOSIS — Z125 Encounter for screening for malignant neoplasm of prostate: Secondary | ICD-10-CM | POA: Diagnosis not present

## 2017-02-03 DIAGNOSIS — E039 Hypothyroidism, unspecified: Secondary | ICD-10-CM | POA: Diagnosis not present

## 2017-02-03 DIAGNOSIS — E038 Other specified hypothyroidism: Secondary | ICD-10-CM

## 2017-02-03 NOTE — Progress Notes (Signed)
Subjective:    Patient ID: Adam Ballard, male    DOB: Apr 14, 1961, 56 y.o.   MRN: [id number]  HPI Here for health maintenance exam and to review chronic medical problems    Doing well   Has been camping and traveling  [place] work and keeping busy  Wife got through ovarian cancer - gradually inc her activity   Continues to decrease his alcohol intake (with his wife)   He is eating better  Avoiding added sugar  Avoiding the processed foods  Cooks their own meat or eats canned tuna    Wt Readings from Last 3 Encounters:  02/03/17 177 lb 8 oz (80.5 kg)  03/09/16 184 lb 12.8 oz (83.8 kg)  02/03/16 178 lb 12.8 oz (81.1 kg)  wt is down without as much processed foods  No soft drinks  Avoids fast food Exercise from various means -hiking/ outdoor work  bmi 25.8  Colonoscopy 10/14- polyps/ recommended repeat in 3 y  He has the letter to schedule  Would be open to doing it in December    Tetanus imm 6/17  Zoster status - aware Shingrix vaccine is out there   Prostate screen  Lab Results  Component Value Date   PSA 0.39 01/16/2017   PSA 0.43 01/29/2016    fam hx of prostate cancer -in father 57 yo) Goes to the urologist-nl exam 9/17 No problems urinating Does have an occ kidney stone  Does try to drink water  Nocturia -none     Hx of hyperlipidemia Lab Results  Component Value Date   CHOL 200 01/16/2017   CHOL 252 (H) 08/04/2016   CHOL 247 (H) 01/29/2016   Lab Results  Component Value Date   HDL 65.10 01/16/2017   HDL 83.40 08/04/2016   HDL 70.50 01/29/2016   Lab Results  Component Value Date   LDLCALC 125 (H) 01/16/2017   LDLCALC 155 (H) 08/04/2016   LDLCALC 167 (H) 01/29/2016   Lab Results  Component Value Date   TRIG 49.0 01/16/2017   TRIG 70.0 08/04/2016   TRIG 52.0 01/29/2016   Lab Results  Component Value Date   CHOLHDL 3 01/16/2017   CHOLHDL 3 08/04/2016   CHOLHDL 4 01/29/2016   Lab Results  Component Value Date   LDLDIRECT 138.8  12/30/2008   LDLDIRECT 142.6 06/27/2008  eating lower cholesterol meats Some steak  Some fish  No fried foods  LDL improved   Hx of elevated tsh Lab Results  Component Value Date   TSH 6.90 (H) 01/16/2017   Last free T4 was 12/17  (0.72)- low end of normal No changes in skin and hair  Energy level is the same  No thyroid enl   Lab Results  Component Value Date   WBC 5.1 01/16/2017   HGB 14.2 01/16/2017   HCT 41.9 01/16/2017   MCV 88.2 01/16/2017   PLT 245.0 01/16/2017     Chemistry      Component Value Date/Time   NA 140 01/16/2017 1205   K 4.9 01/16/2017 1205   CL 104 01/16/2017 1205   CO2 32 01/16/2017 1205   BUN 17 01/16/2017 1205   CREATININE 1.08 01/16/2017 1205      Component Value Date/Time   CALCIUM 9.6 01/16/2017 1205   ALKPHOS 51 01/16/2017 1205   AST 23 01/16/2017 1205   ALT 24 01/16/2017 1205   BILITOT 0.6 01/16/2017 1205       Patient Active Problem List   Diagnosis Date Noted  .  Subclinical hypothyroidism 02/03/2016  . Prostate cancer screening 01/29/2016  . Tremor 05/29/2014  . Routine general medical examination at a health care facility 03/13/2013  . Colon cancer screening 03/13/2013  . Hypersomnia with sleep apnea 07/01/2008  . Hyperlipidemia 06/27/2008  . Allergic rhinitis 06/27/2008   Past Medical History:  Diagnosis Date  . Allergic rhinitis   . History of kidney stones    recur 11/00  . Hyperlipidemia   . Low back pain   . Poison ivy    frequent  . Sleep apnea    uses cpap   Past Surgical History:  Procedure Laterality Date  . ARTHROSCOPIC REPAIR ACL     twice repaired  . BACK SURGERY  06/01/2011   L3-L4 fusion  . DRE 03/04, 08/06    . LITHOTRIPSY    . TRIGGER FINGER RELEASE Left    Social History  Substance Use Topics  . Smoking status: Never Smoker  . Smokeless tobacco: Never Used     Comment: Pt tried ciggarettes several years ago, never habitually smoked.   . Alcohol use 8.4 oz/week    14 Glasses of wine per  week     Comment: 2-3 glasses 3 times/weeks   Family History  Problem Relation Age of Onset  . Heart disease Father   . Diabetes Father        DM2  . Prostate cancer Father   . Leukemia Maternal Grandfather   . Heart disease Paternal Grandfather        MI  . Cirrhosis Maternal Aunt        non-alcoholic  . Colon cancer Neg Hx   . Esophageal cancer Neg Hx   . Rectal cancer Neg Hx   . Stomach cancer Neg Hx    No Known Allergies Current Outpatient Prescriptions on File Prior to Visit  Medication Sig Dispense Refill  . acetaminophen (TYLENOL) 325 MG tablet Take 650 mg by mouth as needed for pain.    . cetirizine (ZYRTEC) 10 MG tablet Take 10 mg by mouth as needed.     Marland Kitchen guaiFENesin (MUCINEX) 600 MG 12 hr tablet Take 600 mg by mouth as needed.     Marland Kitchen NASONEX 50 MCG/ACT nasal spray USE 2 SPRAYS IN EACH NOSTRIL ONCE DAILY AS NEEDED 17 g 3   No current facility-administered medications on file prior to visit.      Review of Systems Review of Systems  Constitutional: Negative for fever, appetite change, fatigue and unexpected weight change.  Eyes: Negative for pain and visual disturbance.  Respiratory: Negative for cough and shortness of breath.   Cardiovascular: Negative for cp or palpitations    Gastrointestinal: Negative for nausea, diarrhea and constipation.  Genitourinary: Negative for urgency and frequency.  Skin: Negative for pallor or rash   Neurological: Negative for weakness, light-headedness, numbness and headaches.  Hematological: Negative for adenopathy. Does not bruise/bleed easily.  Psychiatric/Behavioral: Negative for dysphoric mood. The patient is not nervous/anxious.  pos for stressors        Objective:   Physical Exam  Constitutional: He appears well-developed and well-nourished. No distress.  Well appearing   HENT:  Head: Normocephalic and atraumatic.  Right Ear: External ear normal.  Left Ear: External ear normal.  Nose: Nose normal.  Mouth/Throat:  Oropharynx is clear and moist.  Eyes: Conjunctivae and EOM are normal. Pupils are equal, round, and reactive to light. Right eye exhibits no discharge. Left eye exhibits no discharge. No scleral icterus.  Neck: Normal range of  motion. Neck supple. No JVD present. Carotid bruit is not present. No thyromegaly present.  Cardiovascular: Normal rate, regular rhythm, normal heart sounds and intact distal pulses.  Exam reveals no gallop.   Pulmonary/Chest: Effort normal and breath sounds normal. No respiratory distress. He has no wheezes. He exhibits no tenderness.  Abdominal: Soft. Bowel sounds are normal. He exhibits no distension, no abdominal bruit and no mass. There is no tenderness.  Musculoskeletal: He exhibits no edema or tenderness.  Lymphadenopathy:    He has no cervical adenopathy.  Neurological: He is alert. He has normal reflexes. No cranial nerve deficit. He exhibits normal muscle tone. Coordination normal.  Skin: Skin is warm and dry. No rash noted. No erythema. No pallor.  AK on R temple-small Solar lentigines diffusely   Psychiatric: He has a normal mood and affect.  Pleasant and talkative          Assessment & Plan:   Problem List Items Addressed This Visit      Endocrine   Subclinical hypothyroidism    Lab Results  Component Value Date   TSH 6.90 (H) 01/16/2017   Not symptomatic Last T4 was low normal Continue to watch this and tx if symptomatic         Other   Colon cancer screening    Ref for 3 y recall colonoscopy      Relevant Orders   Ambulatory referral to Gastroenterology   Hyperlipidemia    Disc goals for lipids and reasons to control them Rev labs with pt Rev low sat fat diet in detail Improved LDL with better diet       Prostate cancer screening    Lab Results  Component Value Date   PSA 0.39 01/16/2017   PSA 0.43 01/29/2016   No new symptoms  fam hx - father in at 60 Sees urol-had DRE in the fall      Routine general medical  examination at a health care facility - Primary    Reviewed health habits including diet and exercise and skin cancer prevention Reviewed appropriate screening tests for age  Also reviewed health mt list, fam hx and immunization status , as well as social and family history   See HPI Labs reviewed Disc stressors/doing well overall  Enc exercise and sun protection  Consider shingrix when available  Re for colonsocopy (3 y recall-overdue) Nl psa- f/u with urology in the fall

## 2017-02-03 NOTE — Patient Instructions (Addendum)
We will refer you for your colonoscopy   For cholesterol :   Avoid red meat/ fried foods/ egg yolks/ fatty breakfast meats/ butter, cheese and high fat dairy/ and shellfish    There is a new shingles vaccine called Shingrix - will be available later if you are interested    We will continue to watch your thyroid - if you develop more fatigue or any skin or hair changes let me know   Stay active  Drink lots of water  Use sunscreen - on exposed areas when outdoors

## 2017-02-04 NOTE — Assessment & Plan Note (Signed)
Disc goals for lipids and reasons to control them Rev labs with pt Rev low sat fat diet in detail Improved LDL with better diet

## 2017-02-04 NOTE — Assessment & Plan Note (Signed)
Ref for 3 y recall colonoscopy

## 2017-02-04 NOTE — Assessment & Plan Note (Signed)
Lab Results  Component Value Date   PSA 0.39 01/16/2017   PSA 0.43 01/29/2016   No new symptoms  fam hx - father in at 78 Sees urol-had DRE in the fall

## 2017-02-04 NOTE — Assessment & Plan Note (Signed)
Reviewed health habits including diet and exercise and skin cancer prevention Reviewed appropriate screening tests for age  Also reviewed health mt list, fam hx and immunization status , as well as social and family history   See HPI Labs reviewed Disc stressors/doing well overall  Enc exercise and sun protection  Consider shingrix when available  Re for colonsocopy (3 y recall-overdue) Nl psa- f/u with urology in the fall

## 2017-02-04 NOTE — Assessment & Plan Note (Signed)
Lab Results  Component Value Date   TSH 6.90 (H) 01/16/2017   Not symptomatic Last T4 was low normal Continue to watch this and tx if symptomatic

## 2017-03-06 DIAGNOSIS — H2513 Age-related nuclear cataract, bilateral: Secondary | ICD-10-CM | POA: Diagnosis not present

## 2017-05-18 ENCOUNTER — Encounter: Payer: Self-pay | Admitting: Gastroenterology

## 2017-07-28 ENCOUNTER — Other Ambulatory Visit: Payer: Self-pay

## 2017-07-28 ENCOUNTER — Ambulatory Visit (AMBULATORY_SURGERY_CENTER): Payer: Self-pay | Admitting: *Deleted

## 2017-07-28 VITALS — Ht 70.0 in | Wt 187.0 lb

## 2017-07-28 DIAGNOSIS — Z86010 Personal history of colonic polyps: Secondary | ICD-10-CM

## 2017-07-28 MED ORDER — NA SULFATE-K SULFATE-MG SULF 17.5-3.13-1.6 GM/177ML PO SOLN: ORAL | 0 refills | Status: DC

## 2017-07-28 NOTE — Progress Notes (Signed)
Patient denies any allergies to eggs or soy. Patient denies any problems with anesthesia/sedation. Patient denies any oxygen use at home. Patient denies taking any diet/weight loss medications or blood thinners. EMMI education assisgned to patient on colonoscopy, this was explained and instructions given to patient. 

## 2017-08-11 ENCOUNTER — Ambulatory Visit (AMBULATORY_SURGERY_CENTER): Payer: Federal, State, Local not specified - PPO | Admitting: Gastroenterology

## 2017-08-11 ENCOUNTER — Other Ambulatory Visit: Payer: Self-pay

## 2017-08-11 ENCOUNTER — Encounter: Payer: Self-pay | Admitting: Gastroenterology

## 2017-08-11 VITALS — BP 124/78 | HR 54 | Temp 96.9°F | Resp 10 | Ht 70.0 in | Wt 187.0 lb

## 2017-08-11 DIAGNOSIS — K5730 Diverticulosis of large intestine without perforation or abscess without bleeding: Secondary | ICD-10-CM

## 2017-08-11 DIAGNOSIS — K6389 Other specified diseases of intestine: Secondary | ICD-10-CM | POA: Diagnosis not present

## 2017-08-11 DIAGNOSIS — Z86010 Personal history of colonic polyps: Secondary | ICD-10-CM | POA: Diagnosis not present

## 2017-08-11 DIAGNOSIS — Z1211 Encounter for screening for malignant neoplasm of colon: Secondary | ICD-10-CM | POA: Diagnosis not present

## 2017-08-11 DIAGNOSIS — D125 Benign neoplasm of sigmoid colon: Secondary | ICD-10-CM

## 2017-08-11 MED ORDER — SODIUM CHLORIDE 0.9 % IV SOLN: 500.0000 mL | Freq: Once | INTRAVENOUS | Status: DC

## 2017-08-11 NOTE — Op Note (Signed)
[facility] Patient Name: Adam Ballard Procedure Date: 08/11/2017 7:48 AM MRN: [id number] Endoscopist: Milus Banister , MD Age: 56 Referring MD:  Date of Birth: 02-28-1961 Gender: Male Account #: [id number] Procedure:                Colonoscopy Indications:              High risk colon cancer surveillance: Personal                            history of colonic polyps; colonoscopy 2014 Dr.                            Ardis Hughs found 3 subCM adenomas Medicines:                Monitored Anesthesia Care Procedure:                Pre-Anesthesia Assessment:                           - Prior to the procedure, a History and Physical                            was performed, and patient medications and                            allergies were reviewed. The patient's tolerance of                            previous anesthesia was also reviewed. The risks                            and benefits of the procedure and the sedation                            options and risks were discussed with the patient.                            All questions were answered, and informed consent                            was obtained. Prior Anticoagulants: The patient has                            taken no previous anticoagulant or antiplatelet                            agents. ASA Grade Assessment: II - A patient with                            mild systemic disease. After reviewing the risks                            and benefits, the patient was deemed in  satisfactory condition to undergo the procedure.                           After obtaining informed consent, the colonoscope                            was passed under direct vision. Throughout the                            procedure, the patient's blood pressure, pulse, and                            oxygen saturations were monitored continuously. The                            Colonoscope was introduced through  the anus and                            advanced to the the cecum, identified by                            appendiceal orifice and ileocecal valve. The                            colonoscopy was performed without difficulty. The                            patient tolerated the procedure well. The quality                            of the bowel preparation was good. The ileocecal                            valve, appendiceal orifice, and rectum were                            photographed. Scope In: 7:51:59 AM Scope Out: 8:05:09 AM Scope Withdrawal Time: 0 hours 11 minutes 1 second  Total Procedure Duration: 0 hours 13 minutes 10 seconds  Findings:                 A 3 mm polyp was found in the sigmoid colon. The                            polyp was sessile. The polyp was removed with a                            cold snare. Resection and retrieval were complete.                           Multiple small and large-mouthed diverticula were                            found in the left colon.  The exam was otherwise without abnormality on                            direct and retroflexion views. Complications:            No immediate complications. Estimated blood loss:                            None. Estimated Blood Loss:     Estimated blood loss: none. Impression:               - One 3 mm polyp in the sigmoid colon, removed with                            a cold snare. Resected and retrieved.                           - Diverticulosis in the left colon.                           - The examination was otherwise normal on direct                            and retroflexion views. Recommendation:           - Patient has a contact number available for                            emergencies. The signs and symptoms of potential                            delayed complications were discussed with the                            patient. Return to normal activities  tomorrow.                            Written discharge instructions were provided to the                            patient.                           - Resume previous diet.                           - Continue present medications.                           You will receive a letter within 2-3 weeks with the                            pathology results and my final recommendations.                           If the polyp(s) is proven to be 'pre-cancerous' on  pathology, you will need repeat colonoscopy in 5                            years. Milus Banister, MD 08/11/2017 8:07:33 AM This report has been signed electronically.

## 2017-08-11 NOTE — Progress Notes (Signed)
To PACU, VSS. Report to [email or web address] 

## 2017-08-11 NOTE — Patient Instructions (Signed)
Discharge instructions given. Handouts on polyps and diverticulosis. Resume previous medications. YOU HAD AN ENDOSCOPIC PROCEDURE TODAY AT THE [facility]:   Refer to the procedure report that was given to you for any specific questions about what was found during the examination.  If the procedure report does not answer your questions, please call your gastroenterologist to clarify.  If you requested that your care partner not be given the details of your procedure findings, then the procedure report has been included in a sealed envelope for you to review at your convenience later.  YOU SHOULD EXPECT: Some feelings of bloating in the abdomen. Passage of more gas than usual.  Walking can help get rid of the air that was put into your GI tract during the procedure and reduce the bloating. If you had a lower endoscopy (such as a colonoscopy or flexible sigmoidoscopy) you may notice spotting of blood in your stool or on the toilet paper. If you underwent a bowel prep for your procedure, you may not have a normal bowel movement for a few days.  Please Note:  You might notice some irritation and congestion in your nose or some drainage.  This is from the oxygen used during your procedure.  There is no need for concern and it should clear up in a day or so.  SYMPTOMS TO REPORT IMMEDIATELY:   Following lower endoscopy (colonoscopy or flexible sigmoidoscopy):  Excessive amounts of blood in the stool  Significant tenderness or worsening of abdominal pains  Swelling of the abdomen that is new, acute  Fever of 100F or higher   For urgent or emergent issues, a gastroenterologist can be reached at any hour by calling [phone].   DIET:  We do recommend a small meal at first, but then you may proceed to your regular diet.  Drink plenty of fluids but you should avoid alcoholic beverages for 24 hours.  ACTIVITY:  You should plan to take it easy for the rest of today and you should NOT  DRIVE or use heavy machinery until tomorrow (because of the sedation medicines used during the test).    FOLLOW UP: Our staff will call the number listed on your records the next business day following your procedure to check on you and address any questions or concerns that you may have regarding the information given to you following your procedure. If we do not reach you, we will leave a message.  However, if you are feeling well and you are not experiencing any problems, there is no need to return our call.  We will assume that you have returned to your regular daily activities without incident.  If any biopsies were taken you will be contacted by phone or by letter within the next 1-3 weeks.  Please call us at [phone] if you have not heard about the biopsies in 3 weeks.    SIGNATURES/CONFIDENTIALITY: You and/or your care partner have signed paperwork which will be entered into your electronic medical record.  These signatures attest to the fact that that the information above on your After Visit Summary has been reviewed and is understood.  Full responsibility of the confidentiality of this discharge information lies with you and/or your care-partner. 

## 2017-08-11 NOTE — Progress Notes (Signed)
Pt's states no medical or surgical changes since previsit or office visit. 

## 2017-08-11 NOTE — Progress Notes (Signed)
Called to room to assist during endoscopic procedure.  Patient ID and intended procedure confirmed with present staff. Received instructions for my participation in the procedure from the performing physician.  

## 2017-08-14 ENCOUNTER — Telehealth: Payer: Self-pay

## 2017-08-14 NOTE — Telephone Encounter (Signed)
Unable to leave message mailbox full.

## 2017-08-16 ENCOUNTER — Telehealth: Payer: Self-pay | Admitting: *Deleted

## 2017-08-16 NOTE — Telephone Encounter (Signed)
  Follow up Call-  Call back number 08/11/2017  Post procedure Call Back phone  # [phone]  Permission to leave phone message Yes  Some recent data might be hidden     Patient questions:  Do you have a fever, pain , or abdominal swelling? No. Pain Score  0 *  Have you tolerated food without any problems? Yes.    Have you been able to return to your normal activities? Yes.    Do you have any questions about your discharge instructions: Diet   No. Medications  No. Follow up visit  No.  Do you have questions or concerns about your Care? No.  Actions: * If pain score is 4 or above: No action needed, pain <4.

## 2017-08-21 ENCOUNTER — Encounter: Payer: Self-pay | Admitting: Gastroenterology

## 2017-09-04 DIAGNOSIS — H40033 Anatomical narrow angle, bilateral: Secondary | ICD-10-CM | POA: Diagnosis not present

## 2018-08-01 DIAGNOSIS — N200 Calculus of kidney: Secondary | ICD-10-CM | POA: Diagnosis not present

## 2018-08-01 DIAGNOSIS — N400 Enlarged prostate without lower urinary tract symptoms: Secondary | ICD-10-CM | POA: Diagnosis not present

## 2018-09-10 DIAGNOSIS — H40033 Anatomical narrow angle, bilateral: Secondary | ICD-10-CM | POA: Diagnosis not present

## 2019-02-12 ENCOUNTER — Encounter: Payer: Self-pay | Admitting: Family Medicine

## 2019-02-12 ENCOUNTER — Other Ambulatory Visit: Payer: Self-pay

## 2019-02-12 ENCOUNTER — Ambulatory Visit: Payer: Federal, State, Local not specified - PPO | Admitting: Family Medicine

## 2019-02-12 VITALS — BP 148/72 | HR 53 | Temp 97.3°F | Ht 69.5 in | Wt 180.4 lb

## 2019-02-12 DIAGNOSIS — R109 Unspecified abdominal pain: Secondary | ICD-10-CM | POA: Insufficient documentation

## 2019-02-12 DIAGNOSIS — E039 Hypothyroidism, unspecified: Secondary | ICD-10-CM

## 2019-02-12 DIAGNOSIS — R1011 Right upper quadrant pain: Secondary | ICD-10-CM

## 2019-02-12 DIAGNOSIS — E038 Other specified hypothyroidism: Secondary | ICD-10-CM

## 2019-02-12 DIAGNOSIS — E7800 Pure hypercholesterolemia, unspecified: Secondary | ICD-10-CM | POA: Diagnosis not present

## 2019-02-12 LAB — CBC WITH DIFFERENTIAL/PLATELET
Basophils Absolute: 0 10*3/uL (ref 0.0–0.1)
Basophils Relative: 0.8 % (ref 0.0–3.0)
Eosinophils Absolute: 0.1 10*3/uL (ref 0.0–0.7)
Eosinophils Relative: 2.8 % (ref 0.0–5.0)
HCT: 38.8 % — ABNORMAL LOW (ref 39.0–52.0)
Hemoglobin: 13.3 g/dL (ref 13.0–17.0)
Lymphocytes Relative: 34.4 % (ref 12.0–46.0)
Lymphs Abs: 1.7 10*3/uL (ref 0.7–4.0)
MCHC: 34.3 g/dL (ref 30.0–36.0)
MCV: 89.6 fl (ref 78.0–100.0)
Monocytes Absolute: 0.4 10*3/uL (ref 0.1–1.0)
Monocytes Relative: 7.5 % (ref 3.0–12.0)
Neutro Abs: 2.7 10*3/uL (ref 1.4–7.7)
Neutrophils Relative %: 54.5 % (ref 43.0–77.0)
Platelets: 287 10*3/uL (ref 150.0–400.0)
RBC: 4.33 Mil/uL (ref 4.22–5.81)
RDW: 13 % (ref 11.5–15.5)
WBC: 4.9 10*3/uL (ref 4.0–10.5)

## 2019-02-12 LAB — LIPID PANEL
Cholesterol: 207 mg/dL — ABNORMAL HIGH (ref 0–200)
HDL: 68.3 mg/dL (ref >39.00)
LDL Cholesterol: 129 mg/dL — ABNORMAL HIGH (ref 0–99)
NonHDL: 138.56
Total CHOL/HDL Ratio: 3
Triglycerides: 48 mg/dL (ref 0.0–149.0)
VLDL: 9.6 mg/dL (ref 0.0–40.0)

## 2019-02-12 LAB — COMPREHENSIVE METABOLIC PANEL
ALT: 18 U/L (ref 0–53)
AST: 15 U/L (ref 0–37)
Albumin: 4.4 g/dL (ref 3.5–5.2)
Alkaline Phosphatase: 50 U/L (ref 39–117)
BUN: 18 mg/dL (ref 6–23)
CO2: 28 mEq/L (ref 19–32)
Calcium: 9.1 mg/dL (ref 8.4–10.5)
Chloride: 102 mEq/L (ref 96–112)
Creatinine, Ser: 1.02 mg/dL (ref 0.40–1.50)
GFR: 74.87 mL/min (ref >60.00)
Glucose, Bld: 93 mg/dL (ref 70–99)
Potassium: 4.6 mEq/L (ref 3.5–5.1)
Sodium: 138 mEq/L (ref 135–145)
Total Bilirubin: 0.6 mg/dL (ref 0.2–1.2)
Total Protein: 6.7 g/dL (ref 6.0–8.3)

## 2019-02-12 LAB — LIPASE: Lipase: 31 U/L (ref 11.0–59.0)

## 2019-02-12 LAB — POC URINALSYSI DIPSTICK (AUTOMATED)
Bilirubin, UA: NEGATIVE
Blood, UA: NEGATIVE
Glucose, UA: NEGATIVE
Ketones, UA: NEGATIVE
Leukocytes, UA: NEGATIVE
Nitrite, UA: NEGATIVE
Protein, UA: NEGATIVE
Spec Grav, UA: 1.015 (ref 1.010–1.025)
Urobilinogen, UA: 0.2 E.U./dL
pH, UA: 7 (ref 5.0–8.0)

## 2019-02-12 LAB — TSH: TSH: 2.97 u[IU]/mL (ref 0.35–4.50)

## 2019-02-12 NOTE — Assessment & Plan Note (Signed)
TSH today  No clinical changes  

## 2019-02-12 NOTE — Progress Notes (Signed)
Subjective:    Patient ID: Adam Ballard, male    DOB: Dec 21, 1960, 58 y.o.   MRN: [id number]  HPI Pt presents with flank pain since October Primarily R side  Also due for labs for thyroid and cholesterol    Now some on L side-this feels more like a kidney stone sort of pain  Is drinking a lot of fluids  Has seen Dr Eulogio Ditch -urology   UA today-completely clear  Results for orders placed or performed in visit on 02/12/19  POCT Urinalysis Dipstick (Automated)  Result Value Ref Range   Color, UA Yellow    Clarity, UA Clear    Glucose, UA Negative Negative   Bilirubin, UA Negative    Ketones, UA Negative    Spec Grav, UA 1.015 1.010 - 1.025   Blood, UA Negative    pH, UA 7.0 5.0 - 8.0   Protein, UA Negative Negative   Urobilinogen, UA 0.2 0.2 or 1.0 E.U./dL   Nitrite, UA Negative    Leukocytes, UA Negative Negative     Wt Readings from Last 3 Encounters:  02/12/19 180 lb 6 oz (81.8 kg)  08/11/17 187 lb (84.8 kg)  07/28/17 187 lb (84.8 kg)  has lost weight with hard home work/ exercise  26.25 kg/m   Pain on R side- R upper abd and side  Very dull  Most of the time quite mild  An annoyance - does not keep him from doing anything  Frequently there  Wonders what it is   He does wonder about a gallstone  No nausea however  Not worse with eating  A little worse with sitting slouched -may improve with stretches occ   Exercise -has had a big project at the house Moving sand/ wheelbarrow/ gravel - lots of heavy work  Works [place]   Pain started before this all started   Has generally decreased alcohol intake-not much  Lab Results  Component Value Date   ALT 24 01/16/2017   AST 23 01/16/2017   ALKPHOS 51 01/16/2017   BILITOT 0.6 01/16/2017       Review of Systems  Constitutional: Negative for activity change, appetite change, fatigue, fever and unexpected weight change.  HENT: Negative for congestion, rhinorrhea, sore throat and trouble  swallowing.   Eyes: Negative for pain, redness, itching and visual disturbance.  Respiratory: Negative for cough, chest tightness, shortness of breath and wheezing.   Cardiovascular: Negative for chest pain and palpitations.  Gastrointestinal: Negative for abdominal pain, blood in stool, constipation, diarrhea, nausea and vomiting.  Endocrine: Negative for cold intolerance, heat intolerance, polydipsia and polyuria.  Genitourinary: Negative for difficulty urinating, dysuria, frequency, hematuria and urgency.       Flank pain   Musculoskeletal: Negative for arthralgias, joint swelling and myalgias.  Skin: Negative for pallor and rash.  Neurological: Negative for dizziness, tremors, weakness, numbness and headaches.  Hematological: Negative for adenopathy. Does not bruise/bleed easily.  Psychiatric/Behavioral: Negative for decreased concentration and dysphoric mood. The patient is not nervous/anxious.        Objective:   Physical Exam Constitutional:      General: He is not in acute distress.    Appearance: Normal appearance. He is normal weight. He is not ill-appearing.  HENT:     Head: Normocephalic and atraumatic.     Nose: Nose normal.     Mouth/Throat:     Mouth: Mucous membranes are moist.     Pharynx: Oropharynx is clear.  Eyes:  General: No scleral icterus.    Extraocular Movements: Extraocular movements intact.     Conjunctiva/sclera: Conjunctivae normal.     Pupils: Pupils are equal, round, and reactive to light.  Neck:     Musculoskeletal: Normal range of motion and neck supple. No muscular tenderness.     Vascular: No carotid bruit.  Cardiovascular:     Rate and Rhythm: Bradycardia present.     Pulses: Normal pulses.     Heart sounds: Normal heart sounds.  Pulmonary:     Effort: Pulmonary effort is normal. No respiratory distress.     Breath sounds: Normal breath sounds. No wheezing or rales.  Abdominal:     General: Abdomen is flat. Bowel sounds are normal.  There is no distension.     Palpations: Abdomen is soft. There is no mass.     Tenderness: There is abdominal tenderness. There is no right CVA tenderness, left CVA tenderness, guarding or rebound.     Hernia: No hernia is present.     Comments: Very mild RUQ tenderness to deep palp   Mild LLQ tenderness No rebound or guarding No cva tenderness  Musculoskeletal:     Right lower leg: No edema.     Left lower leg: No edema.  Lymphadenopathy:     Cervical: No cervical adenopathy.  Skin:    General: Skin is warm and dry.     Capillary Refill: Capillary refill takes less than 2 seconds.     Coloration: Skin is not jaundiced or pale.     Findings: No erythema or rash.  Neurological:     Mental Status: He is alert.     Coordination: Coordination normal.     Gait: Gait normal.     Deep Tendon Reflexes: Reflexes normal.  Psychiatric:        Mood and Affect: Mood normal.           Assessment & Plan:   Problem List Items Addressed This Visit      Endocrine   Subclinical hypothyroidism    TSH today  No clinical changes       Relevant Orders   TSH     Other   Hyperlipidemia    Disc goals for lipids and reasons to control them Rev last labs with pt Rev low sat fat diet in detail Lab today Eating more fat lately but also more active      Relevant Orders   Lipid panel   Flank pain    L side- ua neg but pt thinks he may have passed a stone Enc water intake and update if worse or no imp      Relevant Orders   POCT Urinalysis Dipstick (Automated) (Completed)   Right upper quadrant abdominal pain - Primary    Ordered abd [place] to check for gallstones or liver abn Also cmet/cbc/lipase Disc stretching- as muscle strain may contribute given recent heavy work  Update if not starting to improve in a week or if worsening        Relevant Orders   CBC with Differential/Platelet   Comprehensive metabolic panel   Lipase   US Abdomen Complete

## 2019-02-12 NOTE — Assessment & Plan Note (Signed)
Ordered abd [place] to check for gallstones or liver abn Also cmet/cbc/lipase Disc stretching- as muscle strain may contribute given recent heavy work  Update if not starting to improve in a week or if worsening

## 2019-02-12 NOTE — Assessment & Plan Note (Signed)
L side- ua neg but pt thinks he may have passed a stone Enc water intake and update if worse or no imp

## 2019-02-12 NOTE — Assessment & Plan Note (Signed)
Disc goals for lipids and reasons to control them Rev last labs with pt Rev low sat fat diet in detail Lab today Eating more fat lately but also more active

## 2019-02-12 NOTE — Patient Instructions (Signed)
Lets get labs and also an ultrasound for flank and right sided abdominal pain  The office will call you to get the ultrasound set up   If symptoms suddenly worsen please let us know   Also - cholesterol and thyroid labs today   Take care of yourself Watch fat in your diet   Try a warm compress or stretching for pain also  Drink lots of water to prevent/ help pass kidney stones

## 2019-02-13 ENCOUNTER — Ambulatory Visit (INDEPENDENT_AMBULATORY_CARE_PROVIDER_SITE_OTHER): Payer: Federal, State, Local not specified - PPO | Admitting: Family Medicine

## 2019-02-13 ENCOUNTER — Encounter: Payer: Self-pay | Admitting: Family Medicine

## 2019-02-13 ENCOUNTER — Telehealth: Payer: Self-pay

## 2019-02-13 VITALS — BP 148/70 | Temp 98.3°F | Wt 180.0 lb

## 2019-02-13 DIAGNOSIS — Z20828 Contact with and (suspected) exposure to other viral communicable diseases: Secondary | ICD-10-CM | POA: Diagnosis not present

## 2019-02-13 DIAGNOSIS — Z7189 Other specified counseling: Secondary | ICD-10-CM

## 2019-02-13 NOTE — Telephone Encounter (Signed)
Noted  

## 2019-02-13 NOTE — Progress Notes (Signed)
Virtual Visit via Video Note  I connected with Adam Ballard on 02/13/19 at  2:00 PM EDT by a video enabled telemedicine application and verified that I am speaking with the correct person using two identifiers.  Location: Patient: In his home Provider: [facility]   I discussed the limitations of evaluation and management by telemedicine and the availability of in person appointments. The patient expressed understanding and agreed to proceed.  History of Present Illness: This is a 58 yo male who requests video visit to discuss possible Covid 19 exposure.  The patient played golf 4 days ago with a friend who is living girlfriend was subsequently diagnosed with COVID-19.  His direct contact is asymptomatic and has not been tested. They rode a golf cart and stayed outside.  They did not wear masks.  The patient denies any fever, chills, chest pain, shortness of breath, cough, GI symptoms.  Past Medical History:  Diagnosis Date  . Allergic rhinitis   . Allergy   . History of kidney stones    recur 11/00  . Hyperlipidemia   . Low back pain   . Poison ivy    frequent  . Sleep apnea    uses cpap   Past Surgical History:  Procedure Laterality Date  . ARTHROSCOPIC REPAIR ACL     twice repaired  . BACK SURGERY  06/01/2011   L3-L4 fusion  . COLONOSCOPY  2014  . DRE 03/04, 08/06    . LITHOTRIPSY    . TRIGGER FINGER RELEASE Left    Family History  Problem Relation Age of Onset  . Heart disease Father   . Diabetes Father        DM2  . Prostate cancer Father   . Leukemia Maternal Grandfather   . Heart disease Paternal Grandfather        MI  . Cirrhosis Maternal Aunt        non-alcoholic  . Colon cancer Neg Hx   . Esophageal cancer Neg Hx   . Rectal cancer Neg Hx   . Stomach cancer Neg Hx    Social History   Tobacco Use  . Smoking status: Never Smoker  . Smokeless tobacco: Never Used  . Tobacco comment: Pt tried ciggarettes several years ago, never habitually  smoked.   Substance Use Topics  . Alcohol use: Yes    Alcohol/week: 14.0 standard drinks    Types: 14 Glasses of wine per week    Comment: 2-3 glasses daily per pt  . Drug use: No      Observations/Objective: The patient is alert and answers questions appropriately.  Visible skin is unremarkable.  He has normally conversive without shortness of breath.  His mood and affect are appropriate.  BP (!) 148/70   Temp 98.3 F (36.8 C) (Oral)   Wt 180 lb (81.6 kg)   BMI 26.20 kg/m  Wt Readings from Last 3 Encounters:  02/13/19 180 lb (81.6 kg)  02/12/19 180 lb 6 oz (81.8 kg)  08/11/17 187 lb (84.8 kg)   BP Readings from Last 3 Encounters:  02/13/19 (!) 148/70  02/12/19 (!) 148/72  08/11/17 124/78    Assessment and Plan: 1. Advice Given About Covid-19 Virus Infection -Patient with indirect exposure to someone with COVID-19.  He is low risk.  Discussed this with him and sent him information via my chart. -He was instructed to notify the office if he develops symptoms or if his direct contact becomes positive   Clarene Reamer, FNP-BC  [facility], [place]  02/13/2019 2:12 PM   Follow Up Instructions: Visit recap and instructions sent to patient via my chart.   I discussed the assessment and treatment plan with the patient. The patient was provided an opportunity to ask questions and all were answered. The patient agreed with the plan and demonstrated an understanding of the instructions.   The patient was advised to call back or seek an in-person evaluation if the symptoms worsen or if the condition fails to improve as anticipated.     Elby Beck, FNP

## 2019-02-13 NOTE — Telephone Encounter (Signed)
Pt left v/m that he had a visit with Dr Glori Bickers on 02/12/19 and since then has found out that pt was exposed to someone who does not have covid virus but his livein girlfriend is positive for covid. Pt wants to know if he should have covid testing. Last time saw the person was 02/09/19 and Mr Piatek did not wear a mask. Pt does not have any covid symptoms and has not traveled.pt scheduled appt with Glenda Chroman FNP today at 2 PM; pt will have vitals when gets text. (pt did not know this info when seen by Dr Glori Bickers and pt said they did not discuss any detail about covid at visit.)

## 2019-02-22 ENCOUNTER — Ambulatory Visit
Admission: RE | Admit: 2019-02-22 | Discharge: 2019-02-22 | Disposition: A | Discharge status: Home / Self Care | Payer: Federal, State, Local not specified - PPO | Source: Ambulatory Visit | Attending: Family Medicine | Admitting: Family Medicine

## 2019-02-22 DIAGNOSIS — R1010 Upper abdominal pain, unspecified: Secondary | ICD-10-CM | POA: Diagnosis not present

## 2019-02-22 DIAGNOSIS — R1011 Right upper quadrant pain: Secondary | ICD-10-CM

## 2019-03-22 DIAGNOSIS — N200 Calculus of kidney: Secondary | ICD-10-CM | POA: Diagnosis not present

## 2019-04-05 DIAGNOSIS — N200 Calculus of kidney: Secondary | ICD-10-CM | POA: Diagnosis not present

## 2019-11-15 ENCOUNTER — Ambulatory Visit: Payer: Federal, State, Local not specified - PPO | Attending: Internal Medicine

## 2019-11-15 DIAGNOSIS — Z23 Encounter for immunization: Secondary | ICD-10-CM

## 2019-11-15 NOTE — Progress Notes (Signed)
   Covid-19 Vaccination Clinic  Name:  Adam Ballard    MRN: [id number] DOB: 1960/09/14  11/15/2019  [name] was observed post Covid-19 immunization for 15 minutes without incident. He was provided with Vaccine Information Sheet and instruction to access the V-Safe system.   [name] was instructed to call 911 with any severe reactions post vaccine: Marland Kitchen Difficulty breathing  . Swelling of face and throat  . A fast heartbeat  . A bad rash all over body  . Dizziness and weakness   Immunizations Administered    Name Date Dose VIS Date Route   Pfizer COVID-19 Vaccine 11/15/2019 12:52 PM 0.3 mL 07/26/2019 Intramuscular   Manufacturer: [place]   Lot: [id number]   [place]: [id number]

## 2019-11-18 ENCOUNTER — Ambulatory Visit (INDEPENDENT_AMBULATORY_CARE_PROVIDER_SITE_OTHER)
Admission: RE | Admit: 2019-11-18 | Discharge: 2019-11-18 | Disposition: A | Discharge status: Home / Self Care | Payer: Federal, State, Local not specified - PPO | Source: Ambulatory Visit | Attending: Family Medicine | Admitting: Family Medicine

## 2019-11-18 ENCOUNTER — Encounter: Payer: Self-pay | Admitting: Family Medicine

## 2019-11-18 ENCOUNTER — Other Ambulatory Visit: Payer: Self-pay | Admitting: Family Medicine

## 2019-11-18 ENCOUNTER — Other Ambulatory Visit: Payer: Self-pay

## 2019-11-18 ENCOUNTER — Ambulatory Visit: Payer: Federal, State, Local not specified - PPO | Admitting: Family Medicine

## 2019-11-18 VITALS — BP 160/94 | HR 58 | Temp 97.0°F | Ht 69.5 in | Wt 192.1 lb

## 2019-11-18 DIAGNOSIS — R1032 Left lower quadrant pain: Secondary | ICD-10-CM

## 2019-11-18 DIAGNOSIS — R1031 Right lower quadrant pain: Secondary | ICD-10-CM | POA: Diagnosis not present

## 2019-11-18 DIAGNOSIS — M160 Bilateral primary osteoarthritis of hip: Secondary | ICD-10-CM | POA: Diagnosis not present

## 2019-11-18 DIAGNOSIS — M545 Low back pain: Secondary | ICD-10-CM | POA: Diagnosis not present

## 2019-11-18 DIAGNOSIS — R030 Elevated blood-pressure reading, without diagnosis of hypertension: Secondary | ICD-10-CM | POA: Diagnosis not present

## 2019-11-18 DIAGNOSIS — I10 Essential (primary) hypertension: Secondary | ICD-10-CM | POA: Insufficient documentation

## 2019-11-18 DIAGNOSIS — G8929 Other chronic pain: Secondary | ICD-10-CM | POA: Diagnosis not present

## 2019-11-18 NOTE — Assessment & Plan Note (Signed)
Mostly intermittent spasms bilaterally  In context of past L3-4 fusion  No radiculopathy symptoms  Disc lifting/activity and given handout for back and IT rehab (tight IT bilat on exam)  LS film today-pend rad rev  Plan from there  Continue acetaminophen prn

## 2019-11-18 NOTE — Progress Notes (Signed)
Subjective:    Patient ID: Adam Ballard, male    DOB: November 05, 1960, 59 y.o.   MRN: [id number]  This visit occurred during the SARS-CoV-2 public health emergency.  Safety protocols were in place, including screening questions prior to the visit, additional usage of staff PPE, and extensive cleaning of exam room while observing appropriate contact time as indicated for disinfecting solutions.    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  Also worked to build a driveway  Pain started after this  Started having spasms again -like they used to be (for little to no reason)  Saw Adam Ballard in the past and had a fusion which helped up until now   The spasms- grab the hip areas  Flexing hips and flexing at the waist trigger the pain (ie: tying shoes) Weight gain may have worsened this  No radiation   When at its worst hurts from groin to lower back - both sides but mostly the right  Then spasms in low back   It affects his gait   Does not stretch  Does some ab exercises for core      Last MRI of LS: MR Lumbar Spine Wo Contrast (Accession [id number]) (Order [id number]) Imaging Date: 04/19/2011 Department: [facility] AT [address] Released By: Adam Ballard Authorizing: Adam Ship, MD  Exam Status  Status  Final [99]  PACS Intelerad Image Link  Show images for MR Lumbar Spine Wo Contrast  Study Result  *RADIOLOGY REPORT*  Clinical Data: Low back pain down left hip extending to the left knee for past 5 days.  No known injury.  MRI LUMBAR SPINE WITHOUT CONTRAST  Technique:  Multiplanar and multiecho pulse sequences of the lumbar spine were obtained without intravenous contrast.  Comparison: 12/24/2009  [place] imaging plain film exam at which time the last fully open disc space was labeled L5-S1.  Rudimentary disc at the S1-S2 level.  Findings: Last fully open disc space is labeled L5-S1.  Present examination incorporates from T11-12 disc space through the lower sacrum.  Conus L1-2 level.  Paraspinal structures unremarkable.  T11-12 through L2-3 unremarkable.  L3-4: Bulge/shallow broad-based protrusion.  Superimposed small to moderate-sized left foraminal disc cephalad extending protrusion with mass effect upon the exiting left L3 nerve root.  Very mild spinal stenosis slightly greater on the left.  L4-5: Bulge/shallow broad-based protrusion slightly greater left posterior lateral position with mild indentation upon the left ventral aspect of the thecal sac.  L5-S1:  Bulge with shallow broad-based protrusion.  Superimposed small focal caudally extending left posterior lateral disc protrusion with mass effect upon the superior aspect of the left S1 nerve root.  IMPRESSION: L3-4 bulge/shallow broad-based protrusion.  Superimposed small to moderate-sized left foraminal disc cephalad extending protrusion with mass effect upon the exiting left L3 nerve root.  L4-5 bulge/shallow broad-based protrusion slightly greater left posterior lateral position with mild indentation upon the left ventral aspect of the thecal sac.  L5-S1 bulge with shallow broad-based protrusion.  Superimposed small focal caudally extending left posterior lateral disc protrusion with mass effect upon the superior aspect of the left S1 nerve root.    L3-4 fusion was done  Subsequent film: *RADIOLOGY REPORT*  Clinical Data: Lumbar fusion.  LUMBAR SPINE - 2-3 VIEW  Comparison: Lumbar spine MRI 04/19/2011.  Findings: Pedicle screws, posterior rods and interbody bone plug at L3-4.  Good position and alignment without complicating features.  IMPRESSION: L3-4 fusion.  Original  Report Authenticated By: Adam Ballard, M.D.  Last hip xray 2011 Study Result  Clinical Data: Low back pain and bilateral hip pain.   BILATERAL HIP WITH PELVIS - 4+ VIEW   Comparison: None.   Findings: Mild subchondral sclerosis is seen in both hips.  Mild medial joint space narrowing in the right hip.  No fracture.   IMPRESSION: Mild bilateral hip osteoarthritis.  Provider: Felipe Ballard   He takes some tylenol for pain     bp was elevated on first check today  BP Readings from Last 3 Encounters:  11/18/19 (!) 160/94  02/13/19 (!) 148/70  02/12/19 (!) 148/72   Pulse Readings from Last 3 Encounters:  11/18/19 (!) 58  02/12/19 (!) 53  08/11/17 (!) 54   Had his first covid shot on Friday-pleased with it    Has taken wife's cyclobenzaprine when pain is bad  Careful of sedation   Patient Active Problem List   Diagnosis Date Noted  . Bilateral groin pain 11/18/2019  . Elevated blood pressure reading without diagnosis of hypertension 11/18/2019  . Flank pain 02/12/2019  . Right upper quadrant abdominal pain 02/12/2019  . Subclinical hypothyroidism 02/03/2016  . Prostate cancer screening 01/29/2016  . Tremor 05/29/2014  . Routine general medical examination at a health care facility 03/13/2013  . Colon cancer screening 03/13/2013  . Hypersomnia with sleep apnea 07/01/2008  . Hyperlipidemia 06/27/2008  . Allergic rhinitis 06/27/2008  . Low back pain 05/02/2007   Past Medical History:  Diagnosis Date  . Allergic rhinitis   . Allergy   . History of kidney stones    recur 11/00  . Hyperlipidemia   . Low back pain   . Poison ivy    frequent  . Sleep apnea    uses cpap   Past Surgical History:  Procedure Laterality Date  . ARTHROSCOPIC REPAIR ACL     twice repaired  . BACK SURGERY  06/01/2011   L3-L4 fusion  . COLONOSCOPY  2014  . DRE 03/04, 08/06    . LITHOTRIPSY    . TRIGGER FINGER RELEASE Left    Social History   Tobacco Use  . Smoking  status: Never Smoker  . Smokeless tobacco: Never Used  . Tobacco comment: Pt tried ciggarettes several years ago, never habitually smoked.   Substance Use Topics  . Alcohol use: Yes    Alcohol/week: 14.0 standard drinks    Types: 14 Glasses of wine per week    Comment: 2-3 glasses daily per pt  . Drug use: No   Family History  Problem Relation Age of Onset  . Heart disease Father   . Diabetes Father        DM2  . Prostate cancer Father   . Leukemia Maternal Grandfather   . Heart disease Paternal Grandfather        MI  . Cirrhosis Maternal Aunt        non-alcoholic  . Colon cancer Neg Hx   . Esophageal cancer Neg Hx   . Rectal cancer Neg Hx   . Stomach cancer Neg Hx    No Known Allergies Current Outpatient Medications on File Prior to Visit  Medication Sig Dispense Refill  . acetaminophen (  TYLENOL) 325 MG tablet Take 650 mg by mouth as needed for pain.    . cetirizine (ZYRTEC) 10 MG tablet Take 10 mg by mouth as needed.     . Doxylamine Succinate, Sleep, (UNISOM SLEEPTABS PO) Take 1 tablet by mouth at bedtime.    Marland Kitchen guaiFENesin (MUCINEX) 600 MG 12 hr tablet Take 600 mg by mouth as needed.     Marland Kitchen ibuprofen (ADVIL,MOTRIN) 200 MG tablet Take 400 mg by mouth every 6 (six) hours as needed.    Marland Kitchen NASONEX 50 MCG/ACT nasal spray USE 2 SPRAYS IN EACH NOSTRIL ONCE DAILY AS NEEDED 17 g 3  . tamsulosin (FLOMAX) 0.4 MG CAPS capsule Take 0.4 mg by mouth daily.     No current facility-administered medications on file prior to visit.    Review of Systems  Constitutional: Negative for activity change, appetite change, fatigue, fever and unexpected weight change.  HENT: Negative for congestion, rhinorrhea, sore throat and trouble swallowing.   Eyes: Negative for pain, redness, itching and visual disturbance.  Respiratory: Negative for cough, chest tightness, shortness of breath and wheezing.   Cardiovascular: Negative for chest pain and palpitations.  Gastrointestinal: Negative for abdominal  pain, blood in stool, constipation, diarrhea and nausea.  Endocrine: Negative for cold intolerance, heat intolerance, polydipsia and polyuria.  Genitourinary: Negative for difficulty urinating, dysuria, frequency and urgency.  Musculoskeletal: Positive for arthralgias, back pain and gait problem. Negative for joint swelling and myalgias.  Skin: Negative for pallor and rash.  Neurological: Negative for dizziness, tremors, weakness, numbness and headaches.  Hematological: Negative for adenopathy. Does not bruise/bleed easily.  Psychiatric/Behavioral: Negative for decreased concentration and dysphoric mood. The patient is not nervous/anxious.        Objective:   Physical Exam Constitutional:      General: He is not in acute distress.    Appearance: Normal appearance. He is well-developed. He is not ill-appearing or diaphoretic.     Comments: overweight  HENT:     Head: Normocephalic and atraumatic.  Eyes:     General: No scleral icterus.    Conjunctiva/sclera: Conjunctivae normal.     Pupils: Pupils are equal, round, and reactive to light.  Cardiovascular:     Rate and Rhythm: Regular rhythm. Bradycardia present.  Pulmonary:     Effort: Pulmonary effort is normal.     Breath sounds: Normal breath sounds. No wheezing or rales.  Abdominal:     General: Bowel sounds are normal. There is no distension.     Palpations: Abdomen is soft.     Tenderness: There is no abdominal tenderness.  Musculoskeletal:        General: Tenderness present.     Cervical back: Normal range of motion and neck supple.     Lumbar back: Spasms and tenderness present. No swelling, edema, deformity or bony tenderness. Decreased range of motion. Negative right straight leg raise test and negative left straight leg raise test. No scoliosis.     Right hip: No tenderness, bony tenderness or crepitus. Decreased range of motion.     Left hip: No tenderness, bony tenderness or crepitus. Decreased range of motion.      Right lower leg: No edema.     Left lower leg: No edema.     Comments: Some tight lumbar musculature  Very tight IT bands bilaterally (pain with int rotation of both hips as well as full hip flexion) Loss of lumbar lordosis   Flex 90 deg Ext full Nl lat bend and gait  today  Lymphadenopathy:     Cervical: No cervical adenopathy.  Skin:    General: Skin is warm and dry.     Coloration: Skin is not pale.     Findings: No erythema or rash.  Neurological:     Mental Status: He is alert.     Cranial Nerves: No cranial nerve deficit.     Sensory: No sensory deficit.     Motor: No atrophy or abnormal muscle tone.     Coordination: Coordination normal.     Deep Tendon Reflexes: Reflexes are normal and symmetric.     Comments: Negative SLR  Psychiatric:        Mood and Affect: Mood normal.           Assessment & Plan:   Problem List Items Addressed This Visit      Other   Low back pain - Primary    Mostly intermittent spasms bilaterally  In context of past L3-4 fusion  No radiculopathy symptoms  Disc lifting/activity and given handout for back and IT rehab (tight IT bilat on exam)  LS film today-pend rad rev  Plan from there  Continue acetaminophen prn      Relevant Orders   DG Lumbar Spine Complete   Bilateral groin pain    Unsure if related to back pain or gait change from it  Noted early OA on xray of hips in 2011 Repeat films today Acetaminophen prn  Pend rad review      Relevant Orders   DG Hip Unilat W OR W/O Pelvis 2-3 Views Right   Elevated blood pressure reading without diagnosis of hypertension    BP: (!) 160/94    Pt has gained 10 lb with the pandemic Disc plan for wt loss/exercise as tolerated and lower sodium diet   Given handout re: DASH eating plan F/u 3 mo -if not improved will recommend tx

## 2019-11-18 NOTE — Assessment & Plan Note (Signed)
BP: (!) 160/94    Pt has gained 10 lb with the pandemic Disc plan for wt loss/exercise as tolerated and lower sodium diet   Given handout re: DASH eating plan F/u 3 mo -if not improved will recommend tx

## 2019-11-18 NOTE — Patient Instructions (Addendum)
ry some of the stretches in the handouts   xrays now  We will contact you with when results return   Avoid very heavy lifting or twisting and lifting   Blood pressure is high  Watch diet (sodium/calories) and work on weight loss  Follow up in 3 months  If no improvement we may need to treat you

## 2019-11-18 NOTE — Assessment & Plan Note (Signed)
Unsure if related to back pain or gait change from it  Noted early OA on xray of hips in 2011 Repeat films today Acetaminophen prn  Pend rad review

## 2019-12-09 ENCOUNTER — Ambulatory Visit: Payer: Federal, State, Local not specified - PPO | Attending: Internal Medicine

## 2019-12-09 DIAGNOSIS — Z23 Encounter for immunization: Secondary | ICD-10-CM

## 2019-12-09 NOTE — Progress Notes (Signed)
   Covid-19 Vaccination Clinic  Name:  HOLLACE TAWIL    MRN: [id number] DOB: [birth year]  12/09/2019  Mr. Lary was observed post Covid-19 immunization for 15 minutes without incident. He was provided with Vaccine Information Sheet and instruction to access the V-Safe system.   Mr. Bacho was instructed to call 911 with any severe reactions post vaccine: Marland Kitchen Difficulty breathing  . Swelling of face and throat  . A fast heartbeat  . A bad rash all over body  . Dizziness and weakness   Immunizations Administered    Name Date Dose VIS Date Route   Pfizer COVID-19 Vaccine 12/09/2019  1:13 PM 0.3 mL 10/09/2018 Intramuscular   Manufacturer: [place]   Lot: [id number]   [place]: [id number]

## 2020-01-03 DIAGNOSIS — H40033 Anatomical narrow angle, bilateral: Secondary | ICD-10-CM | POA: Diagnosis not present

## 2020-02-27 ENCOUNTER — Ambulatory Visit: Payer: Federal, State, Local not specified - PPO | Admitting: Family Medicine

## 2020-04-06 ENCOUNTER — Encounter: Payer: Self-pay | Admitting: Family Medicine

## 2020-04-06 ENCOUNTER — Ambulatory Visit: Payer: Federal, State, Local not specified - PPO | Admitting: Family Medicine

## 2020-04-06 ENCOUNTER — Other Ambulatory Visit: Payer: Self-pay

## 2020-04-06 VITALS — BP 136/84 | HR 57 | Temp 97.6°F | Ht 69.5 in | Wt 181.4 lb

## 2020-04-06 DIAGNOSIS — E7800 Pure hypercholesterolemia, unspecified: Secondary | ICD-10-CM | POA: Diagnosis not present

## 2020-04-06 DIAGNOSIS — R42 Dizziness and giddiness: Secondary | ICD-10-CM | POA: Diagnosis not present

## 2020-04-06 DIAGNOSIS — Z125 Encounter for screening for malignant neoplasm of prostate: Secondary | ICD-10-CM | POA: Diagnosis not present

## 2020-04-06 DIAGNOSIS — E039 Hypothyroidism, unspecified: Secondary | ICD-10-CM

## 2020-04-06 DIAGNOSIS — R030 Elevated blood-pressure reading, without diagnosis of hypertension: Secondary | ICD-10-CM

## 2020-04-06 DIAGNOSIS — T675XXA Heat exhaustion, unspecified, initial encounter: Secondary | ICD-10-CM | POA: Insufficient documentation

## 2020-04-06 DIAGNOSIS — E038 Other specified hypothyroidism: Secondary | ICD-10-CM

## 2020-04-06 LAB — CBC WITH DIFFERENTIAL/PLATELET
Basophils Absolute: 0.1 10*3/uL (ref 0.0–0.1)
Basophils Relative: 0.9 % (ref 0.0–3.0)
Eosinophils Absolute: 0.1 10*3/uL (ref 0.0–0.7)
Eosinophils Relative: 1.6 % (ref 0.0–5.0)
HCT: 44.5 % (ref 39.0–52.0)
Hemoglobin: 14.9 g/dL (ref 13.0–17.0)
Lymphocytes Relative: 24.5 % (ref 12.0–46.0)
Lymphs Abs: 1.4 10*3/uL (ref 0.7–4.0)
MCHC: 33.5 g/dL (ref 30.0–36.0)
MCV: 90.6 fl (ref 78.0–100.0)
Monocytes Absolute: 0.4 10*3/uL (ref 0.1–1.0)
Monocytes Relative: 6.4 % (ref 3.0–12.0)
Neutro Abs: 3.9 10*3/uL (ref 1.4–7.7)
Neutrophils Relative %: 66.6 % (ref 43.0–77.0)
Platelets: 295 10*3/uL (ref 150.0–400.0)
RBC: 4.91 Mil/uL (ref 4.22–5.81)
RDW: 13.8 % (ref 11.5–15.5)
WBC: 5.9 10*3/uL (ref 4.0–10.5)

## 2020-04-06 LAB — T4, FREE: Free T4: 0.88 ng/dL (ref 0.60–1.60)

## 2020-04-06 LAB — COMPREHENSIVE METABOLIC PANEL
ALT: 24 U/L (ref 0–53)
AST: 19 U/L (ref 0–37)
Albumin: 4.7 g/dL (ref 3.5–5.2)
Alkaline Phosphatase: 48 U/L (ref 39–117)
BUN: 17 mg/dL (ref 6–23)
CO2: 31 mEq/L (ref 19–32)
Calcium: 10.1 mg/dL (ref 8.4–10.5)
Chloride: 101 mEq/L (ref 96–112)
Creatinine, Ser: 1.04 mg/dL (ref 0.40–1.50)
GFR: 72.92 mL/min (ref >60.00)
Glucose, Bld: 107 mg/dL — ABNORMAL HIGH (ref 70–99)
Potassium: 5.1 mEq/L (ref 3.5–5.1)
Sodium: 139 mEq/L (ref 135–145)
Total Bilirubin: 0.6 mg/dL (ref 0.2–1.2)
Total Protein: 7 g/dL (ref 6.0–8.3)

## 2020-04-06 LAB — LIPID PANEL
Cholesterol: 241 mg/dL — ABNORMAL HIGH (ref 0–200)
HDL: 81.3 mg/dL (ref >39.00)
LDL Cholesterol: 147 mg/dL — ABNORMAL HIGH (ref 0–99)
NonHDL: 159.42
Total CHOL/HDL Ratio: 3
Triglycerides: 60 mg/dL (ref 0.0–149.0)
VLDL: 12 mg/dL (ref 0.0–40.0)

## 2020-04-06 LAB — TSH: TSH: 3.75 u[IU]/mL (ref 0.35–4.50)

## 2020-04-06 LAB — PSA: PSA: 0.36 ng/mL (ref 0.10–4.00)

## 2020-04-06 NOTE — Assessment & Plan Note (Signed)
TSH and FT4 today  No clinical changes except heat intolerance

## 2020-04-06 NOTE — Assessment & Plan Note (Signed)
Suspect due to heat illness Lab today inst to go home and rest/drink fluids and stay cool Update if not starting to improve in a week or if worsening

## 2020-04-06 NOTE — Patient Instructions (Addendum)
Get at least 64 oz of fluid daily (mostly water)  Alcohol does not count  Avoid working outdoors in the heat of the day   Labs today   Eat a healthy diet  For cholesterol Avoid red meat/ fried foods/ egg yolks/ fatty breakfast meats/ butter, cheese and high fat dairy/ and shellfish    Try to limit alcohol to 2 drinks per day at most  If you are dizzy- do not drink alcohol   Blood pressure is improved today

## 2020-04-06 NOTE — Assessment & Plan Note (Signed)
This normalized today Labs drawn Intentional wt loss- has helped  Disc HTN and what to watch for  He has a wrist cuff at home to monitor prn  Enc healthy diet/less etoh

## 2020-04-06 NOTE — Assessment & Plan Note (Signed)
With some residual dizziness Strongly suspect ongoing dehydration  He continues to work out in the heat- this has lead to more problems Long disc today re: hydration  Plan to get at least 64 oz (mostly water,not etoh) , and more if working out in Research scientist (medical) s/s of heat illnes to watch for  Reassuring exam

## 2020-04-06 NOTE — Assessment & Plan Note (Signed)
psa added to labs  Family hx noted (father) Some reduction in strength of flow Will plan to f/u with urology as well Has flomax to use prn for renal stones

## 2020-04-06 NOTE — Assessment & Plan Note (Signed)
Labs today  Disc goals for lipids and reasons to control them Rev last labs with pt Rev low sat fat diet in detail Diet fair  Some fatty pork/ice cream

## 2020-04-06 NOTE — Progress Notes (Signed)
Subjective:    Patient ID: Adam Ballard, male    DOB: 06/29/1961, 59 y.o.   MRN: [id number]  This visit occurred during the SARS-CoV-2 public health emergency.  Safety protocols were in place, including screening questions prior to the visit, additional usage of staff PPE, and extensive cleaning of exam room while observing appropriate contact time as indicated for disinfecting solutions.    HPI Pt presents for f/u of chronic health problems   Wt Readings from Last 3 Encounters:  04/06/20 181 lb 7 oz (82.3 kg)  11/18/19 192 lb 1 oz (87.1 kg)  02/13/19 180 lb (81.6 kg)  weight went up and now back down  26.41 kg/m  Notes he had an episode of overheating  Was working at the [employer]  He had several days of exhaustion/low appetite / tremor Did see a paramedic there before he left  Got better and then yesterday worse again (had been working with a chainsaw) Felt fine to start and then it affected him all the summer   Still feels a bit dizzy today  Now is able to eat more regularly     Had a good summer overall   Had covid vaccines Tdap 6/17   He gets plenty of exercise (including lots of outdoor work)  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  occ eats some sweets but is mindful about it  (ice cream is his weakness)     bp is improved today (high in the past) No cp or palpitations or headaches or edema  No side effects to medicines  BP Readings from Last 3 Encounters:  04/06/20 136/84  11/18/19 (!) 160/94  02/13/19 (!) 148/70     Pulse Readings from Last 3 Encounters:  04/06/20 (!) 57  11/18/19 (!) 58  02/12/19 (!) 53  has a home wrist bp cuff-rarely uses it  H/o subclinical hypothyroidism in the past Last tsh was nl  Lab Results  Component Value Date   TSH 2.97 02/12/2019   Due for labs   Hyperlipidemia Lab Results  Component Value Date   CHOL 207 (H) 02/12/2019   HDL 68.30 02/12/2019   [place] 129 (H) 02/12/2019   LDLDIRECT 138.8 12/30/2008    TRIG 48.0 02/12/2019   CHOLHDL 3 02/12/2019   Diet controlled Due for labs  Has been bad about fatty foods    Family history of prostate cancer  Sees urology  Was previously taking flomax He passed one small kidney stone- has appt with urology upcoming Flow is a little slower, nocturia is once per week    Patient Active Problem List   Diagnosis Date Noted   Heat exhaustion 04/06/2020   Dizziness 04/06/2020   Bilateral groin pain 11/18/2019   Elevated blood pressure reading without diagnosis of hypertension 11/18/2019   Flank pain 02/12/2019   Right upper quadrant abdominal pain 02/12/2019   Subclinical hypothyroidism 02/03/2016   Prostate cancer screening 01/29/2016   Tremor 05/29/2014   Routine general medical examination at a health care facility 03/13/2013   Colon cancer screening 03/13/2013   Hypersomnia with sleep apnea 07/01/2008   Hyperlipidemia 06/27/2008   Allergic rhinitis 06/27/2008   Low back pain 05/02/2007   Past Medical History:  Diagnosis Date   Allergic rhinitis    Allergy    History of kidney stones    recur 11/00   Hyperlipidemia    Low back pain    Poison ivy    frequent   Sleep apnea  uses cpap   Past Surgical History:  Procedure Laterality Date   ARTHROSCOPIC REPAIR ACL     twice repaired   BACK SURGERY  06/01/2011   L3-L4 fusion   COLONOSCOPY  2014   DRE 03/04, 08/06     LITHOTRIPSY     TRIGGER FINGER RELEASE Left    Social History   Tobacco Use   Smoking status: Never Smoker   Smokeless tobacco: Never Used   Tobacco comment: Pt tried ciggarettes several years ago, never habitually smoked.   Vaping Use   Vaping Use: Never used  Substance Use Topics   Alcohol use: Yes    Alcohol/week: 14.0 standard drinks    Types: 14 Glasses of wine per week    Comment: 2-3 glasses daily per pt   Drug use: No   Family History  Problem Relation Age of Onset   Heart disease Father    Diabetes Father          DM2   Prostate cancer Father    Leukemia Maternal Grandfather    Heart disease Paternal Grandfather        MI   Cirrhosis Maternal Aunt        non-alcoholic   Colon cancer Neg Hx    Esophageal cancer Neg Hx    Rectal cancer Neg Hx    Stomach cancer Neg Hx    No Known Allergies Current Outpatient Medications on File Prior to Visit  Medication Sig Dispense Refill   acetaminophen (TYLENOL) 325 MG tablet Take 650 mg by mouth as needed for pain.     cetirizine (ZYRTEC) 10 MG tablet Take 10 mg by mouth as needed.      Doxylamine Succinate, Sleep, (UNISOM SLEEPTABS PO) Take 1 tablet by mouth at bedtime.     guaiFENesin (MUCINEX) 600 MG 12 hr tablet Take 600 mg by mouth as needed.      ibuprofen (ADVIL,MOTRIN) 200 MG tablet Take 400 mg by mouth every 6 (six) hours as needed.     NASONEX 50 MCG/ACT nasal spray USE 2 SPRAYS IN EACH NOSTRIL ONCE DAILY AS NEEDED 17 g 3   tamsulosin (FLOMAX) 0.4 MG CAPS capsule Take 0.4 mg by mouth daily as needed. Takes very rarely only when has a kidney stone (Patient not taking: Reported on 04/06/2020)     No current facility-administered medications on file prior to visit.      Review of Systems  Constitutional: Positive for fatigue. Negative for activity change, appetite change, fever and unexpected weight change.  HENT: Negative for congestion, rhinorrhea, sore throat and trouble swallowing.   Eyes: Negative for pain, redness, itching and visual disturbance.  Respiratory: Negative for cough, chest tightness, shortness of breath and wheezing.   Cardiovascular: Negative for chest pain and palpitations.  Gastrointestinal: Negative for abdominal pain, blood in stool, constipation, diarrhea and nausea.  Endocrine: Negative for cold intolerance, heat intolerance, polydipsia and polyuria.  Genitourinary: Negative for difficulty urinating, dysuria, frequency and urgency.  Musculoskeletal: Negative for arthralgias, joint swelling and  myalgias.  Skin: Negative for pallor and rash.  Neurological: Positive for dizziness. Negative for tremors, seizures, syncope, facial asymmetry, speech difficulty, weakness, numbness and headaches.  Hematological: Negative for adenopathy. Does not bruise/bleed easily.  Psychiatric/Behavioral: Negative for decreased concentration and dysphoric mood. The patient is not nervous/anxious.        Objective:   Physical Exam Constitutional:      General: He is not in acute distress.    Appearance: Normal appearance. He  is well-developed and normal weight. He is not ill-appearing or diaphoretic.  HENT:     Head: Normocephalic and atraumatic.     Ears:     Comments: No nystagmus Eyes:     General: No scleral icterus.    Conjunctiva/sclera: Conjunctivae normal.     Pupils: Pupils are equal, round, and reactive to light.  Neck:     Thyroid: No thyromegaly.     Vascular: No carotid bruit or JVD.  Cardiovascular:     Rate and Rhythm: Normal rate and regular rhythm.     Pulses: Normal pulses.     Heart sounds: Normal heart sounds. No gallop.   Pulmonary:     Effort: Pulmonary effort is normal. No respiratory distress.     Breath sounds: Normal breath sounds. No wheezing or rales.  Abdominal:     General: Bowel sounds are normal. There is no distension or abdominal bruit.     Palpations: Abdomen is soft. There is no mass.     Tenderness: There is no abdominal tenderness.  Musculoskeletal:     Cervical back: Normal range of motion and neck supple. No tenderness.     Right lower leg: No edema.     Left lower leg: No edema.  Lymphadenopathy:     Cervical: No cervical adenopathy.  Skin:    General: Skin is warm and dry.     Coloration: Skin is not pale.     Findings: No bruising, erythema or rash.  Neurological:     Mental Status: He is alert.     Cranial Nerves: Cranial nerves are intact.     Sensory: Sensation is intact.     Motor: Motor function is intact. No tremor.      Coordination: Coordination is intact.     Gait: Gait is intact.     Deep Tendon Reflexes: Reflexes are normal and symmetric.           Assessment & Plan:   Problem List Items Addressed This Visit      Endocrine   Subclinical hypothyroidism    TSH and FT4 today  No clinical changes except heat intolerance      Relevant Orders   TSH   T4, free     Other   Hyperlipidemia    Labs today  Disc goals for lipids and reasons to control them Rev last labs with pt Rev low sat fat diet in detail Diet fair  Some fatty pork/ice cream      Relevant Orders   Comprehensive metabolic panel   Lipid panel   Prostate cancer screening    psa added to labs  Family hx noted (father) Some reduction in strength of flow Will plan to f/u with urology as well Has flomax to use prn for renal stones      Relevant Orders   PSA   Elevated blood pressure reading without diagnosis of hypertension - Primary    This normalized today Labs drawn Intentional wt loss- has helped  Disc HTN and what to watch for  He has a wrist cuff at home to monitor prn  Enc healthy diet/less etoh      Heat exhaustion    With some residual dizziness Strongly suspect ongoing dehydration  He continues to work out in the heat- this has lead to more problems Long disc today re: hydration  Plan to get at least 64 oz (mostly water,not etoh) , and more if working out in Research scientist (medical) s/s of  heat illnes to watch for  Reassuring exam       Dizziness    Suspect due to heat illness Lab today inst to go home and rest/drink fluids and stay cool Update if not starting to improve in a week or if worsening        Relevant Orders   CBC with Differential/Platelet   Comprehensive metabolic panel

## 2020-04-07 ENCOUNTER — Encounter: Payer: Self-pay | Admitting: *Deleted

## 2020-12-28 ENCOUNTER — Ambulatory Visit: Payer: Federal, State, Local not specified - PPO | Attending: Internal Medicine

## 2020-12-28 ENCOUNTER — Other Ambulatory Visit: Payer: Self-pay

## 2020-12-28 ENCOUNTER — Other Ambulatory Visit (HOSPITAL_BASED_OUTPATIENT_CLINIC_OR_DEPARTMENT_OTHER): Payer: Self-pay

## 2020-12-28 DIAGNOSIS — Z23 Encounter for immunization: Secondary | ICD-10-CM

## 2020-12-28 MED ORDER — PFIZER-BIONT COVID-19 VAC-TRIS 30 MCG/0.3ML IM SUSP
INTRAMUSCULAR | 0 refills | Status: DC
  Filled 2020-12-28: qty 0.3, 1d supply, fill #0

## 2020-12-28 NOTE — Progress Notes (Signed)
   Covid-19 Vaccination Clinic  Name:  Adam Ballard    MRN: [id number] DOB: 1961/01/24  12/28/2020  Adam Ballard was observed post Covid-19 immunization for 15 minutes without incident. He was provided with Vaccine Information Sheet and instruction to access the V-Safe system.   Adam Ballard was instructed to call 911 with any severe reactions post vaccine: Marland Kitchen Difficulty breathing  . Swelling of face and throat  . A fast heartbeat  . A bad rash all over body  . Dizziness and weakness   Immunizations Administered    Name Date Dose VIS Date Route   PFIZER Comrnaty(Gray TOP) Covid-19 Vaccine 12/28/2020 12:12 PM 0.3 mL 07/23/2020 Intramuscular   Manufacturer: [employer]   Lot: [id number]   [place]: [phone]

## 2021-03-30 DIAGNOSIS — H40013 Open angle with borderline findings, low risk, bilateral: Secondary | ICD-10-CM | POA: Diagnosis not present

## 2021-04-06 ENCOUNTER — Other Ambulatory Visit: Payer: Self-pay

## 2021-04-06 ENCOUNTER — Ambulatory Visit: Payer: Federal, State, Local not specified - PPO | Admitting: Family Medicine

## 2021-04-06 ENCOUNTER — Encounter: Payer: Self-pay | Admitting: Family Medicine

## 2021-04-06 DIAGNOSIS — F430 Acute stress reaction: Secondary | ICD-10-CM | POA: Diagnosis not present

## 2021-04-06 MED ORDER — BUSPIRONE HCL 15 MG PO TABS: 7.5000 mg | ORAL_TABLET | Freq: Two times a day (BID) | ORAL | 3 refills | Status: DC

## 2021-04-06 NOTE — Progress Notes (Signed)
Subjective:    Patient ID: Adam Ballard, male    DOB: 1960/10/11, 60 y.o.   MRN: [id number]  This visit occurred during the SARS-CoV-2 public health emergency.  Safety protocols were in place, including screening questions prior to the visit, additional usage of staff PPE, and extensive cleaning of exam room while observing appropriate contact time as indicated for disinfecting solutions.   HPI Pt presents with c/o anxiety   Wt Readings from Last 3 Encounters:  04/06/21 178 lb 2 oz (80.8 kg)  04/06/20 181 lb 7 oz (82.3 kg)  11/18/19 192 lb 1 oz (87.1 kg)   25.93 kg/m  About 8 weeks ago father went to hospital multiple times for pna  Then his mother had a nervous breakdown and he had to stay with her  Very close to her and feeds off her and he absorbs her mood (she is treated for anxiety)  Other people have a hard time with his mom. Her personality is mean even thought she does not mean to be.   His parents live on 2nd floor of apt. In [place] (the elevator is broken)  Mother won't drive  Father is very weak (afraid he will fall down stairs) He has to get them to their appointments   Last night found water leaking in attic   Sibs can only help on the weekend (they work)   His symptoms  Feels like he is charged with electricity  Tremor is worse (with intention)  Feels light headed at times  Cannot sleep well  Appetite is up and down   Not using any coping skills- nothing helps Is ruminating  Alcohol- has been drinking less /trying to stay sharp  Is being very responsible    He is interested in seeing a therapist   No suicidal ideation  Does feel down some days (not all)    BP Readings from Last 3 Encounters:  04/06/21 (!) 142/81  04/06/20 136/84  11/18/19 (!) 160/94    Pulse Readings from Last 3 Encounters:  04/06/21 75  04/06/20 (!) 57  11/18/19 (!) 58    Subclinical hypothyroid Lab Results  Component Value Date   TSH 3.75 04/06/2020   PHQ score  12 GAD score 16  Patient Active Problem List   Diagnosis Date Noted   Stress reaction 04/06/2021   Heat exhaustion 04/06/2020   Dizziness 04/06/2020   Bilateral groin pain 11/18/2019   Elevated blood pressure reading without diagnosis of hypertension 11/18/2019   Flank pain 02/12/2019   Right upper quadrant abdominal pain 02/12/2019   Subclinical hypothyroidism 02/03/2016   Prostate cancer screening 01/29/2016   Tremor 05/29/2014   Routine general medical examination at a health care facility 03/13/2013   Colon cancer screening 03/13/2013   Hypersomnia with sleep apnea 07/01/2008   Hyperlipidemia 06/27/2008   Allergic rhinitis 06/27/2008   Low back pain 05/02/2007   Past Medical History:  Diagnosis Date   Allergic rhinitis    Allergy    History of kidney stones    recur 11/00   Hyperlipidemia    Low back pain    Poison ivy    frequent   Sleep apnea    uses cpap   Past Surgical History:  Procedure Laterality Date   ARTHROSCOPIC REPAIR ACL     twice repaired   BACK SURGERY  06/01/2011   L3-L4 fusion   COLONOSCOPY  2014   DRE 03/04, 08/06     LITHOTRIPSY     TRIGGER  FINGER RELEASE Left    Social History   Tobacco Use   Smoking status: Never   Smokeless tobacco: Never   Tobacco comments:    Pt tried ciggarettes several years ago, never habitually smoked.   Vaping Use   Vaping Use: Never used  Substance Use Topics   Alcohol use: Yes    Alcohol/week: 14.0 standard drinks    Types: 14 Glasses of wine per week    Comment: 2-3 glasses daily per pt   Drug use: No   Family History  Problem Relation Age of Onset   Heart disease Father    Diabetes Father        DM2   Prostate cancer Father    Leukemia Maternal Grandfather    Heart disease Paternal Grandfather        MI   Cirrhosis Maternal Aunt        non-alcoholic   Colon cancer Neg Hx    Esophageal cancer Neg Hx    Rectal cancer Neg Hx    Stomach cancer Neg Hx    No Known Allergies Current  Outpatient Medications on File Prior to Visit  Medication Sig Dispense Refill   acetaminophen (TYLENOL) 325 MG tablet Take 650 mg by mouth as needed for pain.     cetirizine (ZYRTEC) 10 MG tablet Take 10 mg by mouth as needed.      Doxylamine Succinate, Sleep, (UNISOM SLEEPTABS PO) Take 1 tablet by mouth at bedtime.     guaiFENesin (MUCINEX) 600 MG 12 hr tablet Take 600 mg by mouth as needed.      ibuprofen (ADVIL,MOTRIN) 200 MG tablet Take 400 mg by mouth every 6 (six) hours as needed.     NASONEX 50 MCG/ACT nasal spray USE 2 SPRAYS IN EACH NOSTRIL ONCE DAILY AS NEEDED 17 g 3   tamsulosin (FLOMAX) 0.4 MG CAPS capsule Take 0.4 mg by mouth daily as needed. Takes very rarely only when has a kidney stone     No current facility-administered medications on file prior to visit.    Review of Systems  Constitutional:  Positive for fatigue. Negative for activity change, appetite change, fever and unexpected weight change.  HENT:  Negative for congestion, rhinorrhea, sore throat and trouble swallowing.   Eyes:  Negative for pain, redness, itching and visual disturbance.  Respiratory:  Negative for cough, chest tightness, shortness of breath and wheezing.   Cardiovascular:  Negative for chest pain and palpitations.  Gastrointestinal:  Negative for abdominal pain, blood in stool, constipation, diarrhea and nausea.  Endocrine: Negative for cold intolerance, heat intolerance, polydipsia and polyuria.  Genitourinary:  Negative for difficulty urinating, dysuria, frequency and urgency.  Musculoskeletal:  Negative for arthralgias, joint swelling and myalgias.  Skin:  Negative for pallor and rash.  Neurological:  Negative for dizziness, tremors, weakness, numbness and headaches.  Hematological:  Negative for adenopathy. Does not bruise/bleed easily.  Psychiatric/Behavioral:  Positive for decreased concentration and sleep disturbance. Negative for dysphoric mood and suicidal ideas. The patient is  nervous/anxious.       Objective:   Physical Exam Constitutional:      General: He is not in acute distress.    Appearance: Normal appearance. He is normal weight. He is not ill-appearing or diaphoretic.  Eyes:     Conjunctiva/sclera: Conjunctivae normal.     Pupils: Pupils are equal, round, and reactive to light.  Neck:     Vascular: No carotid bruit.  Cardiovascular:     Rate and Rhythm: Normal  rate and regular rhythm.     Heart sounds: Normal heart sounds.  Pulmonary:     Effort: Pulmonary effort is normal. No respiratory distress.     Breath sounds: Normal breath sounds. No wheezing or rales.  Musculoskeletal:     Cervical back: No tenderness.     Right lower leg: No edema.     Left lower leg: No edema.  Lymphadenopathy:     Cervical: No cervical adenopathy.  Skin:    Coloration: Skin is not pale.     Findings: No erythema or rash.  Neurological:     Mental Status: He is alert.     Sensory: No sensory deficit.     Coordination: Coordination normal.     Deep Tendon Reflexes: Reflexes normal.     Comments: Mild hand tremor with intention   Psychiatric:        Attention and Perception: Attention normal.        Mood and Affect: Mood is anxious. Affect is not tearful.        Speech: Speech normal.        Behavior: Behavior normal.        Cognition and Memory: Cognition and memory normal.     Comments: Mildly anxious  Pleasant  Good insight  Candidly discusses stressors and symptoms           Assessment & Plan:   Problem List Items Addressed This Visit       Other   Stress reaction    With caregiving of parents and family issues Overall good coping techniques Reviewed stressors/ coping techniques/symptoms/ support sources/ tx options and side effects in detail today Referral made for counseling  Px buspar 7.5 mg bid (can titrate later if needed) Discussed expectations of this medication including time to effectiveness and mechanism of action, also poss of  side effects (early and late)- including mental fuzziness, weight or appetite change, nausea and poss of worse dep or anxiety (even suicidal thoughts)  Pt voiced understanding and will stop med and update if this occurs  Plan for f/u in 2-4 weeks        Relevant Medications   busPIRone (BUSPAR) 15 MG tablet   Other Relevant Orders   Ambulatory referral to Psychology

## 2021-04-06 NOTE — Patient Instructions (Signed)
Start buspar 1/2 pill twice daily  If intolerable side effects or you feel worse/severely depressed- stop it and let us know   If suicidal- go to [facility]   Take care of yourself  Get help when you need it   I placed a referral for counseling -you will get a call   Follow up in 2-4 weeks

## 2021-04-09 NOTE — Assessment & Plan Note (Signed)
With caregiving of parents and family issues Overall good coping techniques Reviewed stressors/ coping techniques/symptoms/ support sources/ tx options and side effects in detail today Referral made for counseling  Px buspar 7.5 mg bid (can titrate later if needed) Discussed expectations of this medication including time to effectiveness and mechanism of action, also poss of side effects (early and late)- including mental fuzziness, weight or appetite change, nausea and poss of worse dep or anxiety (even suicidal thoughts)  Pt voiced understanding and will stop med and update if this occurs  Plan for f/u in 2-4 weeks

## 2021-04-16 ENCOUNTER — Ambulatory Visit (INDEPENDENT_AMBULATORY_CARE_PROVIDER_SITE_OTHER): Payer: Federal, State, Local not specified - PPO | Admitting: Psychology

## 2021-04-16 DIAGNOSIS — F4322 Adjustment disorder with anxiety: Secondary | ICD-10-CM

## 2021-04-20 ENCOUNTER — Ambulatory Visit: Payer: Federal, State, Local not specified - PPO | Admitting: Family Medicine

## 2021-04-22 ENCOUNTER — Encounter: Payer: Self-pay | Admitting: Family Medicine

## 2021-04-22 ENCOUNTER — Ambulatory Visit: Payer: Federal, State, Local not specified - PPO | Admitting: Family Medicine

## 2021-04-22 ENCOUNTER — Other Ambulatory Visit: Payer: Self-pay

## 2021-04-22 VITALS — BP 132/80 | HR 53 | Temp 97.9°F | Ht 69.5 in | Wt 179.6 lb

## 2021-04-22 DIAGNOSIS — F430 Acute stress reaction: Secondary | ICD-10-CM

## 2021-04-22 DIAGNOSIS — Z23 Encounter for immunization: Secondary | ICD-10-CM | POA: Diagnosis not present

## 2021-04-22 MED ORDER — BUSPIRONE HCL 15 MG PO TABS: 7.5000 mg | ORAL_TABLET | Freq: Two times a day (BID) | ORAL | 11 refills | Status: DC

## 2021-04-22 NOTE — Assessment & Plan Note (Signed)
Doing better despite escalation of stress  Handling it well  Urged to continue counseling  Also enc self care  buspar is helping- he takes 5 mg bid now-may inc later if needed  Will update if worse or failure to continue improving

## 2021-04-22 NOTE — Progress Notes (Signed)
Subjective:    Patient ID: Adam Ballard, male    DOB: 10-Jan-1961, 60 y.o.   MRN: [id number]  This visit occurred during the SARS-CoV-2 public health emergency.  Safety protocols were in place, including screening questions prior to the visit, additional usage of staff PPE, and extensive cleaning of exam room while observing appropriate contact time as indicated for disinfecting solutions.   HPI Pt presents for f/u of stress reaction   Wt Readings from Last 3 Encounters:  04/22/21 179 lb 9 oz (81.4 kg)  04/06/21 178 lb 2 oz (80.8 kg)  04/06/20 181 lb 7 oz (82.3 kg)   26.14 kg/m  Last visit disc stress of caring for parents  Referral made for counseling  Px buspar 7.5 mg bid   Some improvement in mood   Situation is not a whole lot better  His father is starting to feel better, mobile and can drive Mother however is worse and refuses to take her medication and will not eat Was adm to the hospital (bowel issues and fever) -yesterday   He did adjust med- he took 1/3 of the pill (made him a little dizzy and upset stomach)  Still thinks it is worth taking   Feels the stress but not freaking out (can make decisions) Stays motivated   Has an appt with [facility] on 9/14     BP Readings from Last 3 Encounters:  04/22/21 132/80  04/06/21 (!) 142/81  04/06/20 136/84    Pulse Readings from Last 3 Encounters:  04/22/21 (!) 53  04/06/21 75  04/06/20 (!) 57   Patient Active Problem List   Diagnosis Date Noted   Stress reaction 04/06/2021   Heat exhaustion 04/06/2020   Dizziness 04/06/2020   Bilateral groin pain 11/18/2019   Elevated blood pressure reading without diagnosis of hypertension 11/18/2019   Flank pain 02/12/2019   Right upper quadrant abdominal pain 02/12/2019   Subclinical hypothyroidism 02/03/2016   Prostate cancer screening 01/29/2016   Tremor 05/29/2014   Routine general medical examination at a health care facility 03/13/2013   Colon cancer  screening 03/13/2013   Hypersomnia with sleep apnea 07/01/2008   Hyperlipidemia 06/27/2008   Allergic rhinitis 06/27/2008   Low back pain 05/02/2007   Past Medical History:  Diagnosis Date   Allergic rhinitis    Allergy    History of kidney stones    recur 11/00   Hyperlipidemia    Low back pain    Poison ivy    frequent   Sleep apnea    uses cpap   Past Surgical History:  Procedure Laterality Date   ARTHROSCOPIC REPAIR ACL     twice repaired   BACK SURGERY  06/01/2011   L3-L4 fusion   COLONOSCOPY  2014   DRE 03/04, 08/06     LITHOTRIPSY     TRIGGER FINGER RELEASE Left    Social History   Tobacco Use   Smoking status: Never   Smokeless tobacco: Never   Tobacco comments:    Pt tried ciggarettes several years ago, never habitually smoked.   Vaping Use   Vaping Use: Never used  Substance Use Topics   Alcohol use: Yes    Alcohol/week: 14.0 standard drinks    Types: 14 Glasses of wine per week    Comment: 2-3 glasses daily per pt   Drug use: No   Family History  Problem Relation Age of Onset   Heart disease Father    Diabetes Father  DM2   Prostate cancer Father    Leukemia Maternal Grandfather    Heart disease Paternal Grandfather        MI   Cirrhosis Maternal Aunt        non-alcoholic   Colon cancer Neg Hx    Esophageal cancer Neg Hx    Rectal cancer Neg Hx    Stomach cancer Neg Hx    No Known Allergies Current Outpatient Medications on File Prior to Visit  Medication Sig Dispense Refill   acetaminophen (TYLENOL) 325 MG tablet Take 650 mg by mouth as needed for pain.     cetirizine (ZYRTEC) 10 MG tablet Take 10 mg by mouth as needed.      Doxylamine Succinate, Sleep, (UNISOM SLEEPTABS PO) Take 1 tablet by mouth at bedtime.     guaiFENesin (MUCINEX) 600 MG 12 hr tablet Take 600 mg by mouth as needed.      ibuprofen (ADVIL,MOTRIN) 200 MG tablet Take 400 mg by mouth every 6 (six) hours as needed.     NASONEX 50 MCG/ACT nasal spray USE 2 SPRAYS IN  EACH NOSTRIL ONCE DAILY AS NEEDED 17 g 3   tamsulosin (FLOMAX) 0.4 MG CAPS capsule Take 0.4 mg by mouth daily as needed. Takes very rarely only when has a kidney stone     No current facility-administered medications on file prior to visit.      Review of Systems  Constitutional:  Negative for activity change, appetite change, fatigue, fever and unexpected weight change.  HENT:  Negative for congestion, rhinorrhea, sore throat and trouble swallowing.   Eyes:  Negative for pain, redness, itching and visual disturbance.  Respiratory:  Negative for cough, chest tightness, shortness of breath and wheezing.   Cardiovascular:  Negative for chest pain and palpitations.  Gastrointestinal:  Negative for abdominal pain, blood in stool, constipation, diarrhea and nausea.  Endocrine: Negative for cold intolerance, heat intolerance, polydipsia and polyuria.  Genitourinary:  Negative for difficulty urinating, dysuria, frequency and urgency.  Musculoskeletal:  Negative for arthralgias, joint swelling and myalgias.  Skin:  Negative for pallor and rash.  Neurological:  Negative for dizziness, tremors, weakness, numbness and headaches.       Was a little dizzy with higher dose of buspar  Hematological:  Negative for adenopathy. Does not bruise/bleed easily.  Psychiatric/Behavioral:  Negative for decreased concentration and dysphoric mood. The patient is nervous/anxious.       Objective:   Physical Exam Constitutional:      General: He is not in acute distress.    Appearance: Normal appearance. He is normal weight. He is not ill-appearing or diaphoretic.  Eyes:     Conjunctiva/sclera: Conjunctivae normal.     Pupils: Pupils are equal, round, and reactive to light.  Cardiovascular:     Rate and Rhythm: Regular rhythm. Bradycardia present.     Heart sounds: Normal heart sounds.  Musculoskeletal:     Cervical back: Normal range of motion and neck supple.  Skin:    Coloration: Skin is not pale.   Neurological:     Mental Status: He is alert.     Cranial Nerves: No cranial nerve deficit.     Motor: No tremor.     Coordination: Coordination normal.  Psychiatric:        Attention and Perception: Attention normal.        Mood and Affect: Mood is anxious.        Speech: Speech normal.        Cognition  and Memory: Cognition and memory normal.     Comments: Less anxious than last visit Pleasant  Candidly discusses his stressors and symptoms           Assessment & Plan:   Problem List Items Addressed This Visit       Other   Stress reaction - Primary    Doing better despite escalation of stress  Handling it well  Urged to continue counseling  Also enc self care  buspar is helping- he takes 5 mg bid now-may inc later if needed  Will update if worse or failure to continue improving      Relevant Medications   busPIRone (BUSPAR) 15 MG tablet   Other Visit Diagnoses     Need for influenza vaccination       Relevant Orders   Flu Vaccine QUAD 6+ mos PF IM (Fluarix Quad PF) (Completed)

## 2021-04-22 NOTE — Patient Instructions (Addendum)
Continue the buspar   We can go up on the dose or switch it if needed   Keep up the counseling   Flu shot today

## 2021-04-28 ENCOUNTER — Ambulatory Visit (INDEPENDENT_AMBULATORY_CARE_PROVIDER_SITE_OTHER): Payer: Federal, State, Local not specified - PPO | Admitting: Psychology

## 2021-04-28 DIAGNOSIS — F4322 Adjustment disorder with anxiety: Secondary | ICD-10-CM

## 2021-05-12 ENCOUNTER — Ambulatory Visit (INDEPENDENT_AMBULATORY_CARE_PROVIDER_SITE_OTHER): Payer: Federal, State, Local not specified - PPO | Admitting: Psychology

## 2021-05-12 DIAGNOSIS — F4322 Adjustment disorder with anxiety: Secondary | ICD-10-CM

## 2021-05-17 DIAGNOSIS — N200 Calculus of kidney: Secondary | ICD-10-CM | POA: Diagnosis not present

## 2021-05-18 DIAGNOSIS — N200 Calculus of kidney: Secondary | ICD-10-CM | POA: Diagnosis not present

## 2021-05-28 ENCOUNTER — Other Ambulatory Visit: Payer: Self-pay

## 2021-05-28 ENCOUNTER — Ambulatory Visit (INDEPENDENT_AMBULATORY_CARE_PROVIDER_SITE_OTHER): Payer: Federal, State, Local not specified - PPO | Admitting: Psychology

## 2021-05-28 DIAGNOSIS — F4322 Adjustment disorder with anxiety: Secondary | ICD-10-CM | POA: Diagnosis not present

## 2021-06-04 DIAGNOSIS — K5730 Diverticulosis of large intestine without perforation or abscess without bleeding: Secondary | ICD-10-CM | POA: Diagnosis not present

## 2021-06-04 DIAGNOSIS — Z981 Arthrodesis status: Secondary | ICD-10-CM | POA: Diagnosis not present

## 2021-06-04 DIAGNOSIS — I700 Atherosclerosis of aorta: Secondary | ICD-10-CM | POA: Diagnosis not present

## 2021-06-04 DIAGNOSIS — N200 Calculus of kidney: Secondary | ICD-10-CM | POA: Diagnosis not present

## 2021-06-08 ENCOUNTER — Other Ambulatory Visit: Payer: Self-pay | Admitting: Urology

## 2021-06-09 NOTE — Progress Notes (Signed)
Sent message, via epic in basket, requesting orders in epic from surgeon.  

## 2021-06-14 NOTE — Patient Instructions (Signed)
DUE TO COVID-19 ONLY ONE VISITOR IS ALLOWED TO COME WITH YOU AND STAY IN THE WAITING ROOM ONLY DURING PRE OP AND PROCEDURE DAY OF SURGERY IF YOU ARE GOING HOME AFTER SURGERY. IF YOU ARE SPENDING THE NIGHT 2 PEOPLE MAY VISIT WITH YOU IN YOUR PRIVATE ROOM AFTER SURGERY UNTIL VISITING  HOURS ARE OVER AT 800 PM AND THE 2 VISITORS CANNOT SPEND THE NIGHT.                 Adam Ballard     Your procedure is scheduled on: 06/21/21   Report to [facility] Main  Entrance   Report to short stay at 5:15 AM     Call this number if you have problems the morning of surgery [phone]    Remember: Do not eat food  :After Midnight the night before your surgery,   You may have clear liquids from midnight until ---.     BRUSH YOUR TEETH MORNING OF SURGERY AND RINSE YOUR MOUTH OUT, NO CHEWING GUM CANDY OR MINTS.     Take these medicines the morning of surgery with A SIP OF WATER: Buspirone                                You may not have any metal on your body including              piercings  Do not wear jewelry, lotions, powders or deodorant             Men may shave face and neck.   Do not bring valuables to the hospital. [place].  Contacts, dentures or bridgework may not be worn into surgery.     Patients discharged the day of surgery will not be allowed to drive home.  IF YOU ARE HAVING SURGERY AND GOING HOME THE SAME DAY, YOU MUST HAVE AN ADULT TO DRIVE YOU HOME AND BE WITH YOU FOR 24 HOURS. YOU MAY GO HOME BY TAXI OR UBER OR ORTHERWISE, BUT AN ADULT MUST ACCOMPANY YOU HOME AND STAY WITH YOU FOR 24 HOURS.  Name and phone number of your driver:  Special Instructions: N/A              Please read over the following fact sheets you were given: _____________________________________________________________________             [facility] - Preparing for Surgery Before surgery, you can play an important role.  Because skin is not  sterile, your skin needs to be as free of germs as possible.  You can reduce the number of germs on your skin by washing with CHG (chlorahexidine gluconate) soap before surgery.  CHG is an antiseptic cleaner which kills germs and bonds with the skin to continue killing germs even after washing. Please DO NOT use if you have an allergy to CHG or antibacterial soaps.  If your skin becomes reddened/irritated stop using the CHG and inform your nurse when you arrive at Short Stay.  You may shave your face/neck. Please follow these instructions carefully:  1.  Shower with CHG Soap the night before surgery and the  morning of Surgery.  2.  If you choose to wash your hair, wash your hair first as usual with your  normal  shampoo.  3.  After you  shampoo, rinse your hair and body thoroughly to remove the  shampoo.                         4.  Use CHG as you would any other liquid soap.  You can apply chg directly  to the skin and wash                       Gently with a scrungie or clean washcloth.  5.  Apply the CHG Soap to your body ONLY FROM THE NECK DOWN.   Do not use on face/ open                           Wound or open sores. Avoid contact with eyes, ears mouth and genitals (private parts).                       Wash face,  Genitals (private parts) with your normal soap.             6.  Wash thoroughly, paying special attention to the area where your surgery  will be performed.  7.  Thoroughly rinse your body with warm water from the neck down.  8.  DO NOT shower/wash with your normal soap after using and rinsing off  the CHG Soap.                9.  Pat yourself dry with a clean towel.            10.  Wear clean pajamas.            11.  Place clean sheets on your bed the night of your first shower and do not  sleep with pets. Day of Surgery : Do not apply any lotions/deodorants the morning of surgery.  Please wear clean clothes to the hospital/surgery center.  FAILURE TO FOLLOW THESE INSTRUCTIONS MAY  RESULT IN THE CANCELLATION OF YOUR SURGERY PATIENT SIGNATURE_________________________________  NURSE SIGNATURE__________________________________  ________________________________________________________________________

## 2021-06-15 ENCOUNTER — Other Ambulatory Visit: Payer: Self-pay

## 2021-06-15 ENCOUNTER — Encounter (HOSPITAL_COMMUNITY)
Admission: RE | Admit: 2021-06-15 | Discharge: 2021-06-15 | Disposition: A | Discharge status: Home / Self Care | Payer: Federal, State, Local not specified - PPO | Source: Ambulatory Visit | Attending: Urology | Admitting: Urology

## 2021-06-15 ENCOUNTER — Encounter (HOSPITAL_COMMUNITY): Payer: Self-pay

## 2021-06-15 DIAGNOSIS — Z01812 Encounter for preprocedural laboratory examination: Secondary | ICD-10-CM | POA: Insufficient documentation

## 2021-06-15 HISTORY — DX: Unspecified dementia, unspecified severity, without behavioral disturbance, psychotic disturbance, mood disturbance, and anxiety: F03.90

## 2021-06-15 HISTORY — DX: Anxiety disorder, unspecified: F41.9

## 2021-06-15 NOTE — Progress Notes (Signed)
COVID test- NA  PCP - Dr. Jerilynn Mages. Administrator, sports - none  Chest x-ray - no EKG - no Stress Test - no ECHO - no Cardiac Cath - no Pacemaker/ICD device last checked:NA  Sleep Study - yes CPAP - yes but not lately  Fasting Blood Sugar - NA Checks Blood Sugar _____ times a day  Blood Thinner Instructions:NA Aspirin Instructions: Last Dose:  Anesthesia review: no  Patient denies shortness of breath, fever, cough and chest pain at PAT appointment No SOB with any activities. Pt is under a lot of stress caring for ill parents.  Patient verbalized understanding of instructions that were given to them at the PAT appointment. Patient was also instructed that they will need to review over the PAT instructions again at home before surgery. yes

## 2021-06-17 ENCOUNTER — Ambulatory Visit (INDEPENDENT_AMBULATORY_CARE_PROVIDER_SITE_OTHER): Payer: Federal, State, Local not specified - PPO | Admitting: Psychology

## 2021-06-17 DIAGNOSIS — F4322 Adjustment disorder with anxiety: Secondary | ICD-10-CM | POA: Diagnosis not present

## 2021-06-20 NOTE — Anesthesia Preprocedure Evaluation (Addendum)
Anesthesia Evaluation  Patient identified by MRN, date of birth, ID band Patient awake    Reviewed: Allergy & Precautions, NPO status , Patient's Chart, lab work & pertinent test results  History of Anesthesia Complications Negative for: history of anesthetic complications  Airway Mallampati: II  TM Distance: >3 FB Neck ROM: Full    Dental no notable dental hx.    Pulmonary sleep apnea and Continuous Positive Airway Pressure Ventilation ,    Pulmonary exam normal        Cardiovascular negative cardio ROS Normal cardiovascular exam     Neuro/Psych Anxiety Dementia negative neurological ROS     GI/Hepatic negative GI ROS, Neg liver ROS,   Endo/Other  Hypothyroidism   Renal/GU Right renal stone  negative genitourinary   Musculoskeletal negative musculoskeletal ROS (+)   Abdominal   Peds  Hematology negative hematology ROS (+)   Anesthesia Other Findings Day of surgery medications reviewed with patient.  Reproductive/Obstetrics negative OB ROS                            Anesthesia Physical Anesthesia Plan  ASA: 2  Anesthesia Plan: General   Post-op Pain Management:    Induction: Intravenous  PONV Risk Score and Plan: 2 and Treatment may vary due to age or medical condition, Ondansetron, Dexamethasone and Midazolam  Airway Management Planned: LMA  Additional Equipment: None  Intra-op Plan:   Post-operative Plan: Extubation in OR  Informed Consent: I have reviewed the patients History and Physical, chart, labs and discussed the procedure including the risks, benefits and alternatives for the proposed anesthesia with the patient or authorized representative who has indicated his/her understanding and acceptance.     Dental advisory given  Plan Discussed with: CRNA  Anesthesia Plan Comments:        Anesthesia Quick Evaluation

## 2021-06-21 ENCOUNTER — Ambulatory Visit (HOSPITAL_COMMUNITY): Payer: Federal, State, Local not specified - PPO

## 2021-06-21 ENCOUNTER — Ambulatory Visit (HOSPITAL_COMMUNITY): Payer: Federal, State, Local not specified - PPO | Admitting: Certified Registered"

## 2021-06-21 ENCOUNTER — Encounter (HOSPITAL_COMMUNITY): Payer: Self-pay | Admitting: Urology

## 2021-06-21 ENCOUNTER — Ambulatory Visit (HOSPITAL_COMMUNITY)
Admission: RE | Admit: 2021-06-21 | Discharge: 2021-06-21 | Disposition: A | Discharge status: Home / Self Care | Payer: Federal, State, Local not specified - PPO | Attending: Urology | Admitting: Urology

## 2021-06-21 ENCOUNTER — Encounter (HOSPITAL_COMMUNITY)
Admission: RE | Disposition: A | Discharge status: Home / Self Care | Payer: Self-pay | Source: Home / Self Care | Attending: Urology

## 2021-06-21 DIAGNOSIS — N200 Calculus of kidney: Secondary | ICD-10-CM | POA: Insufficient documentation

## 2021-06-21 DIAGNOSIS — F0390 Unspecified dementia without behavioral disturbance: Secondary | ICD-10-CM | POA: Insufficient documentation

## 2021-06-21 DIAGNOSIS — G4730 Sleep apnea, unspecified: Secondary | ICD-10-CM | POA: Insufficient documentation

## 2021-06-21 DIAGNOSIS — N400 Enlarged prostate without lower urinary tract symptoms: Secondary | ICD-10-CM | POA: Diagnosis not present

## 2021-06-21 DIAGNOSIS — E039 Hypothyroidism, unspecified: Secondary | ICD-10-CM | POA: Insufficient documentation

## 2021-06-21 DIAGNOSIS — Z87442 Personal history of urinary calculi: Secondary | ICD-10-CM | POA: Diagnosis not present

## 2021-06-21 DIAGNOSIS — E890 Postprocedural hypothyroidism: Secondary | ICD-10-CM | POA: Diagnosis not present

## 2021-06-21 DIAGNOSIS — J309 Allergic rhinitis, unspecified: Secondary | ICD-10-CM | POA: Diagnosis not present

## 2021-06-21 DIAGNOSIS — E785 Hyperlipidemia, unspecified: Secondary | ICD-10-CM | POA: Diagnosis not present

## 2021-06-21 HISTORY — PX: CYSTOSCOPY WITH RETROGRADE PYELOGRAM, URETEROSCOPY AND STENT PLACEMENT: SHX5789

## 2021-06-21 HISTORY — PX: HOLMIUM LASER APPLICATION: SHX5852

## 2021-06-21 SURGERY — CYSTOURETEROSCOPY, WITH RETROGRADE PYELOGRAM AND STENT INSERTION
Anesthesia: General | Laterality: Right

## 2021-06-21 MED ORDER — FENTANYL CITRATE PF 50 MCG/ML IJ SOSY
25.0000 ug | PREFILLED_SYRINGE | INTRAMUSCULAR | Status: DC | PRN
  Administered 2021-06-21: 50 ug via INTRAVENOUS

## 2021-06-21 MED ORDER — TRAMADOL HCL 50 MG PO TABS: 50.0000 mg | ORAL_TABLET | Freq: Four times a day (QID) | ORAL | 0 refills | Status: DC | PRN

## 2021-06-21 MED ORDER — FENTANYL CITRATE PF 50 MCG/ML IJ SOSY
PREFILLED_SYRINGE | INTRAMUSCULAR | Status: AC
  Filled 2021-06-21: qty 1

## 2021-06-21 MED ORDER — DEXAMETHASONE SODIUM PHOSPHATE 10 MG/ML IJ SOLN
INTRAMUSCULAR | Status: AC
  Filled 2021-06-21: qty 1

## 2021-06-21 MED ORDER — FENTANYL CITRATE (PF) 100 MCG/2ML IJ SOLN
INTRAMUSCULAR | Status: AC
  Filled 2021-06-21: qty 2

## 2021-06-21 MED ORDER — ONDANSETRON HCL 4 MG/2ML IJ SOLN
INTRAMUSCULAR | Status: DC | PRN
  Administered 2021-06-21: 4 mg via INTRAVENOUS

## 2021-06-21 MED ORDER — ORAL CARE MOUTH RINSE: 15.0000 mL | Freq: Once | OROMUCOSAL | Status: AC

## 2021-06-21 MED ORDER — PROPOFOL 10 MG/ML IV BOLUS
INTRAVENOUS | Status: AC
  Filled 2021-06-21: qty 40

## 2021-06-21 MED ORDER — OXYCODONE HCL 5 MG PO TABS
ORAL_TABLET | ORAL | Status: AC
  Filled 2021-06-21: qty 1

## 2021-06-21 MED ORDER — PROMETHAZINE HCL 25 MG/ML IJ SOLN: 6.2500 mg | INTRAMUSCULAR | Status: DC | PRN

## 2021-06-21 MED ORDER — OXYCODONE HCL 5 MG/5ML PO SOLN: 5.0000 mg | Freq: Once | ORAL | Status: AC | PRN

## 2021-06-21 MED ORDER — CEFAZOLIN SODIUM-DEXTROSE 2-4 GM/100ML-% IV SOLN
2.0000 g | INTRAVENOUS | Status: AC
  Administered 2021-06-21: 2 g via INTRAVENOUS
  Filled 2021-06-21: qty 100

## 2021-06-21 MED ORDER — SODIUM CHLORIDE 0.9 % IR SOLN
Status: DC | PRN
  Administered 2021-06-21: 3000 mL via INTRAVESICAL
  Administered 2021-06-21: 1000 mL via INTRAVESICAL
  Administered 2021-06-21: 3000 mL via INTRAVESICAL

## 2021-06-21 MED ORDER — CEPHALEXIN 500 MG PO CAPS: 500.0000 mg | ORAL_CAPSULE | Freq: Two times a day (BID) | ORAL | 0 refills | Status: AC

## 2021-06-21 MED ORDER — EPHEDRINE SULFATE-NACL 50-0.9 MG/10ML-% IV SOSY
PREFILLED_SYRINGE | INTRAVENOUS | Status: DC | PRN
  Administered 2021-06-21 (×2): 10 mg via INTRAVENOUS

## 2021-06-21 MED ORDER — LACTATED RINGERS IV SOLN: INTRAVENOUS | Status: DC

## 2021-06-21 MED ORDER — MIDAZOLAM HCL 2 MG/2ML IJ SOLN
INTRAMUSCULAR | Status: AC
  Filled 2021-06-21: qty 2

## 2021-06-21 MED ORDER — PROPOFOL 10 MG/ML IV BOLUS
INTRAVENOUS | Status: DC | PRN
  Administered 2021-06-21: 150 mg via INTRAVENOUS

## 2021-06-21 MED ORDER — ONDANSETRON HCL 4 MG/2ML IJ SOLN
INTRAMUSCULAR | Status: AC
  Filled 2021-06-21: qty 2

## 2021-06-21 MED ORDER — MIDAZOLAM HCL 2 MG/2ML IJ SOLN
INTRAMUSCULAR | Status: DC | PRN
Start: 2021-06-21 — End: 2021-06-21
  Administered 2021-06-21: 2 mg via INTRAVENOUS

## 2021-06-21 MED ORDER — DEXAMETHASONE SODIUM PHOSPHATE 10 MG/ML IJ SOLN
INTRAMUSCULAR | Status: DC | PRN
  Administered 2021-06-21: 4 mg via INTRAVENOUS

## 2021-06-21 MED ORDER — LIDOCAINE 2% (20 MG/ML) 5 ML SYRINGE
INTRAMUSCULAR | Status: DC | PRN
  Administered 2021-06-21: 100 mg via INTRAVENOUS

## 2021-06-21 MED ORDER — LIDOCAINE HCL (PF) 2 % IJ SOLN
INTRAMUSCULAR | Status: AC
  Filled 2021-06-21: qty 5

## 2021-06-21 MED ORDER — IOHEXOL 300 MG/ML  SOLN
INTRAMUSCULAR | Status: DC | PRN
  Administered 2021-06-21: 10 mL via URETHRAL

## 2021-06-21 MED ORDER — FENTANYL CITRATE (PF) 100 MCG/2ML IJ SOLN
INTRAMUSCULAR | Status: DC | PRN
  Administered 2021-06-21: 25 ug via INTRAVENOUS
  Administered 2021-06-21: 100 ug via INTRAVENOUS
  Administered 2021-06-21: 25 ug via INTRAVENOUS

## 2021-06-21 MED ORDER — CHLORHEXIDINE GLUCONATE 0.12 % MT SOLN
15.0000 mL | Freq: Once | OROMUCOSAL | Status: AC
  Administered 2021-06-21: 15 mL via OROMUCOSAL

## 2021-06-21 MED ORDER — OXYCODONE HCL 5 MG PO TABS
5.0000 mg | ORAL_TABLET | Freq: Once | ORAL | Status: AC | PRN
  Administered 2021-06-21: 5 mg via ORAL

## 2021-06-21 MED ORDER — ACETAMINOPHEN 500 MG PO TABS
1000.0000 mg | ORAL_TABLET | Freq: Once | ORAL | Status: AC
  Administered 2021-06-21: 1000 mg via ORAL
  Filled 2021-06-21: qty 2

## 2021-06-21 SURGICAL SUPPLY — 25 items
BAG COUNTER SPONGE SURGICOUNT (BAG) IMPLANT
BAG SPNG CNTER NS LX DISP (BAG)
BAG URO CATCHER STRL LF (MISCELLANEOUS) ×2 IMPLANT
CATH URETL OPEN END 6FR 70 (CATHETERS) ×1 IMPLANT
CLOTH BEACON ORANGE TIMEOUT ST (SAFETY) ×2 IMPLANT
COVER SURGICAL LIGHT HANDLE (MISCELLANEOUS) ×1 IMPLANT
EXTRACTOR STONE NITINOL NGAGE (UROLOGICAL SUPPLIES) ×1 IMPLANT
FIBER LASER MOSES 365 DFL (Laser) ×1 IMPLANT
GLOVE SURG ENC TEXT LTX SZ8 (GLOVE) ×2 IMPLANT
GOWN STRL REUS W/TWL XL LVL3 (GOWN DISPOSABLE) ×2 IMPLANT
GUIDEWIRE ANG ZIPWIRE 038X150 (WIRE) ×1 IMPLANT
GUIDEWIRE STR DUAL SENSOR (WIRE) ×3 IMPLANT
KIT TURNOVER KIT A (KITS) IMPLANT
LASER FIB FLEXIVA PULSE ID 365 (Laser) IMPLANT
MANIFOLD NEPTUNE II (INSTRUMENTS) ×2 IMPLANT
PACK CYSTO (CUSTOM PROCEDURE TRAY) ×2 IMPLANT
PENCIL SMOKE EVACUATOR (MISCELLANEOUS) IMPLANT
SHEATH NAVIGATOR HD 11/13X28 (SHEATH) IMPLANT
SHEATH NAVIGATOR HD 11/13X36 (SHEATH) ×1 IMPLANT
SHEATH NAVIGATOR HD 12/14X46 (SHEATH) IMPLANT
STENT URET 6FRX26 CONTOUR (STENTS) ×1 IMPLANT
TRACTIP FLEXIVA PULS ID 200XHI (Laser) IMPLANT
TRACTIP FLEXIVA PULSE ID 200 (Laser)
TUBING CONNECTING 10 (TUBING) ×2 IMPLANT
TUBING UROLOGY SET (TUBING) ×2 IMPLANT

## 2021-06-21 NOTE — Anesthesia Procedure Notes (Signed)
Procedure Name: LMA Insertion Date/Time: 06/21/2021 7:37 AM Performed by: Eben Burow, CRNA Pre-anesthesia Checklist: Patient identified, Emergency Drugs available, Suction available, Patient being monitored and Timeout performed Patient Re-evaluated:Patient Re-evaluated prior to induction Oxygen Delivery Method: Circle system utilized Preoxygenation: Pre-oxygenation with 100% oxygen Induction Type: IV induction Ventilation: Mask ventilation without difficulty LMA: LMA inserted LMA Size: 4.0 Number of attempts: 1 Tube secured with: Tape Dental Injury: Teeth and Oropharynx as per pre-operative assessment

## 2021-06-21 NOTE — Interval H&P Note (Signed)
History and Physical Interval Note:  06/21/2021 7:29 AM  Adam Ballard  has presented today for surgery, with the diagnosis of RIGHT RENAL STONE.  The various methods of treatment have been discussed with the patient and family. After consideration of risks, benefits and other options for treatment, the patient has consented to  Procedure(s): CYSTOSCOPY WITH RETROGRADE PYELOGRAM, URETEROSCOPY AND STENT PLACEMENT (Right) HOLMIUM LASER APPLICATION (Right) as a surgical intervention.  The patient's history has been reviewed, patient examined, no change in status, stable for surgery.  I have reviewed the patient's chart and labs.  Questions were answered to the patient's satisfaction.     Lillette Boxer [name]

## 2021-06-21 NOTE — Discharge Instructions (Signed)
You may see some blood in the urine and may have some burning with urination for 48-72 hours. You also may notice that you have to urinate more frequently or urgently after your procedure which is normal.  You should call should you develop an inability urinate, fever > 101, persistent nausea and vomiting that prevents you from eating or drinking to stay hydrated.  If you have a stent, you will likely urinate more frequently and urgently until the stent is removed and you may experience some discomfort/pain in the lower abdomen and flank especially when urinating. You may take pain medication prescribed to you if needed for pain. You may also intermittently have blood in the urine until the stent is removed.  It is okay to remove the stent on Thursday morning by pulling on the thread coming out of her urethra.

## 2021-06-21 NOTE — Transfer of Care (Signed)
Immediate Anesthesia Transfer of Care Note  Patient: Adam Ballard  Procedure(s) Performed: CYSTOSCOPY WITH RETROGRADE PYELOGRAM, URETEROSCOPY AND STENT PLACEMENT (Right) HOLMIUM LASER APPLICATION (Right)  Patient Location: PACU  Anesthesia Type:General  Level of Consciousness: awake, alert  and patient cooperative  Airway & Oxygen Therapy: Patient Spontanous Breathing and Patient connected to face mask oxygen  Post-op Assessment: Report given to RN and Post -op Vital signs reviewed and stable  Post vital signs: Reviewed and stable  Last Vitals:  Vitals Value Taken Time  BP    Temp    Pulse 63 06/21/21 0858  Resp 11 06/21/21 0858  SpO2 99 % 06/21/21 0858  Vitals shown include unvalidated device data.  Last Pain:  Vitals:   06/21/21 0552  TempSrc:   PainSc: 0-No pain         Complications: No notable events documented.

## 2021-06-21 NOTE — H&P (Signed)
H&P  Chief Complaint: Rt sided kidney stone  History of Present Illness: 60 yo male presents for ureteroscopic mgmt of symptomatic Rt renal calculi.  Past Medical History:  Diagnosis Date   Allergic rhinitis    Allergy    Anxiety    Dementia ([place])    History of kidney stones    recur 11/00   Hyperlipidemia    Low back pain    Poison ivy    frequent   Sleep apnea    uses cpap    Past Surgical History:  Procedure Laterality Date   ARTHROSCOPIC REPAIR ACL Right 1998   twice repaired   BACK SURGERY  06/01/2011   L3-L4 fusion   COLONOSCOPY  2014   DRE 03/04, 08/06     LITHOTRIPSY     TRIGGER FINGER RELEASE Left 1998    Home Medications:    Allergies: No Known Allergies  Family History  Problem Relation Age of Onset   Heart disease Father    Diabetes Father        DM2   Prostate cancer Father    Leukemia Maternal Grandfather    Heart disease Paternal Grandfather        MI   Cirrhosis Maternal Aunt        non-alcoholic   Colon cancer Neg Hx    Esophageal cancer Neg Hx    Rectal cancer Neg Hx    Stomach cancer Neg Hx     Social History:  reports that he has never smoked. He has never used smokeless tobacco. He reports current alcohol use of about 14.0 standard drinks per week. He reports that he does not use drugs.  ROS: A complete review of systems was performed.  All systems are negative except for pertinent findings as noted.  Physical Exam:  Vital signs in last 24 hours: BP (!) 148/93   Pulse 64   Temp 98.4 F (36.9 C) (Oral)   Resp 16   Ht 5\' 10"  (1.778 m)   Wt 81.2 kg   SpO2 99%   BMI 25.69 kg/m  Constitutional:  Alert and oriented, No acute distress Cardiovascular: Regular rate  Respiratory: Normal respiratory effort GI: Abdomen is soft, nontender, nondistended, no abdominal masses. No CVAT.  Genitourinary: Normal male phallus, testes are descended bilaterally and non-tender and without masses, scrotum is normal in appearance without lesions  or masses, perineum is normal on inspection. Lymphatic: No lymphadenopathy Neurologic: Grossly intact, no focal deficits Psychiatric: Normal mood and affect  Laboratory Data:  No results for input(s): WBC, HGB, HCT, PLT in the last 72 hours.  No results for input(s): NA, K, CL, GLUCOSE, BUN, CALCIUM, CREATININE in the last 72 hours.  Invalid input(s): CO3   No results found for this or any previous visit (from the past 24 hour(s)). No results found for this or any previous visit (from the past 240 hour(s)).  Renal Function: No results for input(s): CREATININE in the last 168 hours. CrCl cannot be calculated (Patient's most recent lab result is older than the maximum 21 days allowed.).  Radiologic Imaging: No results found.  Impression/Assessment:  Rt renal calculi  Plan:  Cysto, Rt RGP, Rt URS, HLL, extraction, J2 stent

## 2021-06-21 NOTE — Op Note (Signed)
Preoperative diagnosis: Right renal calculi  Postoperative diagnosis: Same  Principal procedure: Cystoscopy, right retrograde ureteropyelogram, fluoroscopic interpretation, right ureteroscopy (semirigid, flexible), holmium laser and extraction of multiple right renal calculi, placement of 6 French by 26 cm contour double-J stent with tether  Surgeon: Lavella Myren  Anesthesia: General with LMA  Complications: None  Estimated blood loss: Less than 10 cc  Specimen: Stone fragments  Indications: A 60 year old male with prior history of urolithiasis.  Presented earlier this fall with intermittent right flank pain.  Evaluation included CT scan which showed a right renal pelvic stone, inflammatory reaction of the right renal pelvis as well as calyceal calculi.  He presents at this time for ureteroscopic management of the above.  I have discussed the surgery, expected outcomes, risks and complications with him.  These include but are not limited to ureteral injury, bleeding, need for stent, stent symptoms, infection, anesthetic complications.  He understands these and desires to proceed.  Findings: Urethra was normal, prostate nonobstructive.  Urothelium of the bladder was normal.  Ureteral orifices were normal in location and configuration.  Retrograde study of the right ureter and upper urinary tract revealed a normal ureter throughout, filling defect in the renal pelvis consistent with the known stone.  No other filling defects noted.  Multiple calyceal calculi, quite small, were noted on direct ureteroscopy as well as the moderate sized renal pelvic stone.  Description of procedure: The patient was properly identified and marked in the holding area.  She received preoperative IV antibiotics.  Was taken to the operating room where general anesthetic was administered with the LMA.  He was placed in the dorsolithotomy position.  Genitalia and perineum were prepped, draped, proper timeout performed.  21  French panendoscope advanced under direct vision with the above-mentioned findings noted once in the bladder.  Utilizing a 6 [place] open-ended catheter and Omnipaque general right retrograde ureteropyelogram was performed with the above-mentioned findings.  Following this, sensor tip guidewire was advanced into the upper pole calyceal system using fluoroscopic guidance.  The cystoscope and open-ended catheter were then removed.  I gently dilated the right ureter first with the obturator then the entire 12/14 French medium length ureteral access catheter.  This was then removed leaving the safety wire in place.  I then passed the long 6 French dual-lumen semirigid ureteroscope easily into the renal pelvis.  Unfortunately, the stone was not seen as it was layering posteriorly.  This was then removed, I then passed the ureteral access catheter again, left a safety wire in, and passed the dual-lumen flexible digital ureteroscope into the renal pelvis.  The entire renal pelvis and calyceal system were systematically inspected.  The known stone was present in the renal pelvis.  This was fragmented with a 365 m laser fiber using a power of 0.8 J.  Multiple smaller fragments resulted, these were easily removed using the engage basket.  I then inspected the calyceal system.  Multiple small calyceal stones were present attached to the papillae.  Most of these were able to be knocked free and removed.  Following this, inspection of the entire renal pelvis and calyceal system revealed all but tiny fragments remaining.  The smaller fragments could not be grasped with the engage basket so it was felt that they would easily passed along the stent or after the stent was removed.  Following extraction of all stone matter, the ureteroscope and access catheter were removed.  The guidewire was backloaded through the cystoscope, and using fluoroscopic and cystoscopic guidance  a 6 [place] by 26 cm contour double-J stent with tether was  left on was then passed.  Once the guidewire was removed, excellent curls were seen proximally and distally using fluoroscopy and cystoscopy, respectively.  The bladder was drained.  The scope was removed.  The tether was tied in a knot right outside the urethral meatus, trimmed, and taped to the patient's penis.  At this point, the procedure was terminated.  The patient was awakened, taken to the PACU in stable condition having tolerated the procedure well

## 2021-06-21 NOTE — OR Nursing (Signed)
Stone taken by Dr. Dahlstedt. 

## 2021-06-21 NOTE — Anesthesia Postprocedure Evaluation (Signed)
Anesthesia Post Note  Patient: [name]  Procedure(s) Performed: CYSTOSCOPY WITH RETROGRADE PYELOGRAM, URETEROSCOPY AND STENT PLACEMENT (Right) HOLMIUM LASER APPLICATION (Right)     Patient location during evaluation: PACU Anesthesia Type: General Level of consciousness: awake and alert and oriented Pain management: pain level controlled Vital Signs Assessment: post-procedure vital signs reviewed and stable Respiratory status: spontaneous breathing, nonlabored ventilation and respiratory function stable Cardiovascular status: blood pressure returned to baseline Postop Assessment: no apparent nausea or vomiting Anesthetic complications: no   No notable events documented.  Last Vitals:  Vitals:   06/21/21 0915 06/21/21 0930  BP: (!) 151/77 (!) 148/83  Pulse: 61 (!) 57  Resp: 14 12  Temp:    SpO2: 93% 96%    Last Pain:  Vitals:   06/21/21 0900  TempSrc:   PainSc: 0-No pain                 Marthenia Rolling

## 2021-06-22 ENCOUNTER — Encounter (HOSPITAL_COMMUNITY): Payer: Self-pay | Admitting: Urology

## 2021-06-23 ENCOUNTER — Ambulatory Visit: Payer: Federal, State, Local not specified - PPO | Admitting: Family Medicine

## 2021-06-23 ENCOUNTER — Encounter: Payer: Self-pay | Admitting: Family Medicine

## 2021-06-23 ENCOUNTER — Other Ambulatory Visit: Payer: Self-pay

## 2021-06-23 ENCOUNTER — Ambulatory Visit (INDEPENDENT_AMBULATORY_CARE_PROVIDER_SITE_OTHER)
Admission: RE | Admit: 2021-06-23 | Discharge: 2021-06-23 | Disposition: A | Discharge status: Home / Self Care | Payer: Federal, State, Local not specified - PPO | Source: Ambulatory Visit | Attending: Family Medicine | Admitting: Family Medicine

## 2021-06-23 DIAGNOSIS — M79672 Pain in left foot: Secondary | ICD-10-CM | POA: Insufficient documentation

## 2021-06-23 DIAGNOSIS — M7989 Other specified soft tissue disorders: Secondary | ICD-10-CM | POA: Diagnosis not present

## 2021-06-23 NOTE — Progress Notes (Signed)
   Subjective:    Patient ID: Adam Ballard, male    DOB: 04/22/1961, 60 y.o.   MRN: [id number]  This visit occurred during the SARS-CoV-2 public health emergency.  Safety protocols were in place, including screening questions prior to the visit, additional usage of staff PPE, and extensive cleaning of exam room while observing appropriate contact time as indicated for disinfecting solutions.   HPI Pt presents with c/o L foot pain and swelling   Wt Readings from Last 3 Encounters:  06/23/21 178 lb (80.7 kg)  06/21/21 179 lb 0.2 oz (81.2 kg)  06/15/21 179 lb (81.2 kg)   25.54 kg/m  Left foot is swollen and hurts  Started on Tuesday   Pain on plantar surface of foot at base of 3,4th metatarsals  Feels like he is walking on a lump  Dull like a big bruise  It will ache when not doing anything but worse to walk   Has used a crutch to offload it   No pain or swelling in calf or upper leg     Wonders if he has some plantar fasciitis or metatarsal fracture  No new shoes    Had laser lithotripsy on Monday  and did well -was out of it   Did work outside on sunday  Review of Systems     Objective:   Physical Exam Constitutional:      General: He is not in acute distress.    Appearance: Normal appearance. He is normal weight. He is not ill-appearing or diaphoretic.  Cardiovascular:     Rate and Rhythm: Normal rate and regular rhythm.     Pulses: Normal pulses.     Heart sounds: Normal heart sounds.  Pulmonary:     Effort: Pulmonary effort is normal.     Breath sounds: Normal breath sounds.  Musculoskeletal:     Right foot: Normal.     Left foot: Normal range of motion and normal capillary refill. Swelling, tenderness and bony tenderness present. No deformity, prominent metatarsal heads or crepitus. Normal pulse.     Comments: L foot is mildly swollen (diffuse)  Well perfused with nl sensation  Tender on plantar surface at the base of 2,3,4th metatarsals No mass noted   No individual joint tenderness or erythema or warmth  He has pain to bear full wt  Skin:    Coloration: Skin is not pale.     Findings: No bruising, erythema, lesion or rash.  Neurological:     Mental Status: He is alert.     Sensory: No sensory deficit.     Motor: No weakness.     Coordination: Coordination normal.  Psychiatric:        Mood and Affect: Mood normal.          Assessment & Plan:   Problem List Items Addressed This Visit       Other   Left foot pain    Pain on sole of foot at base of middle metatarsals w/o mass or joint tenderness  Some diffuse swelling  Differential includes stress fracture, arthritis, metatarsalgia, morton's neuroma  Disc use of more supportive shoes, elevation, ice  inst to use voltaren gel four times daily  Xray orderd today Pending rad rev to make further plan      Relevant Orders   DG Foot Complete Left

## 2021-06-23 NOTE — Assessment & Plan Note (Signed)
Pain on sole of foot at base of middle metatarsals w/o mass or joint tenderness  Some diffuse swelling  Differential includes stress fracture, arthritis, metatarsalgia, morton's neuroma  Disc use of more supportive shoes, elevation, ice  inst to use voltaren gel four times daily  Xray orderd today Pending rad rev to make further plan

## 2021-06-23 NOTE — Patient Instructions (Addendum)
Use ice for 15 minutes whenever you can  Elevate your foot  Wear your most comfortable shoe   Use cane/crutch if needed   Xray now  Will make plan after we get a result   You can try voltaren gel over the counter-apply to affected area about 4 times per day

## 2021-06-25 ENCOUNTER — Encounter: Payer: Self-pay | Admitting: Family Medicine

## 2021-06-27 ENCOUNTER — Telehealth: Payer: Self-pay | Admitting: Family Medicine

## 2021-06-27 DIAGNOSIS — M79672 Pain in left foot: Secondary | ICD-10-CM

## 2021-06-27 NOTE — Telephone Encounter (Signed)
-----   Message from Tammi Sou, [place] sent at 06/25/2021  1:07 PM EST ----- Pt viewed results and Dr. Marliss Coots comments via Deloris Ping and has since sent a response, please see pt's mychart message.

## 2021-06-27 NOTE — Telephone Encounter (Signed)
Podiatry referral done He will get a call

## 2021-06-30 ENCOUNTER — Encounter: Payer: Self-pay | Admitting: Podiatry

## 2021-06-30 ENCOUNTER — Other Ambulatory Visit: Payer: Self-pay

## 2021-06-30 ENCOUNTER — Ambulatory Visit: Payer: Federal, State, Local not specified - PPO | Admitting: Podiatry

## 2021-06-30 DIAGNOSIS — M778 Other enthesopathies, not elsewhere classified: Secondary | ICD-10-CM

## 2021-06-30 DIAGNOSIS — M722 Plantar fascial fibromatosis: Secondary | ICD-10-CM | POA: Diagnosis not present

## 2021-06-30 MED ORDER — TRIAMCINOLONE ACETONIDE 40 MG/ML IJ SUSP
20.0000 mg | Freq: Once | INTRAMUSCULAR | Status: AC
Start: 2021-06-30 — End: 2021-06-30
  Administered 2021-06-30: 20 mg

## 2021-06-30 NOTE — Progress Notes (Signed)
Subjective:  Patient ID: Adam Ballard, male    DOB: 03-Jul-1961,  MRN: [id number] HPI Chief Complaint  Patient presents with   Foot Pain    Left foot - patient had a procedure done 1.5 week ago (not foot related), since surgery has had dorsal forefoot swelling, pain along lateral side, now had noticed pain has moved to plantar heel and some medial ankle, PCP xrayed 06/23/21, tried Ibuprofen   New Patient (Initial Visit)    60 y.o. male presents with the above complaint.   ROS denies fever chills nausea vomiting muscle aches pains calf pain back pain chest pain shortness of breath.    Past Medical History:  Diagnosis Date   Allergic rhinitis    Allergy    Anxiety    Dementia ([place])    History of kidney stones    recur 11/00   Hyperlipidemia    Low back pain    Poison ivy    frequent   Sleep apnea    uses cpap   Past Surgical History:  Procedure Laterality Date   ARTHROSCOPIC REPAIR ACL Right 1998   twice repaired   BACK SURGERY  06/01/2011   L3-L4 fusion   COLONOSCOPY  2014   [place], URETEROSCOPY AND STENT PLACEMENT Right 06/21/2021   Procedure: CYSTOSCOPY WITH RETROGRADE PYELOGRAM, URETEROSCOPY AND STENT PLACEMENT;  Surgeon: Franchot Gallo, MD;  Location: [facility];  Service: Urology;  Laterality: Right;   DRE 03/04, 08/06     HOLMIUM LASER APPLICATION Right 65/0/3546   Procedure: HOLMIUM LASER APPLICATION;  Surgeon: Franchot Gallo, MD;  Location: [facility];  Service: Urology;  Laterality: Right;   LITHOTRIPSY     TRIGGER FINGER RELEASE Left 1998    Current Outpatient Medications:    busPIRone (BUSPAR) 15 MG tablet, Take 0.5 tablets (7.5 mg total) by mouth 2 (two) times daily. (Patient taking differently: Take 5 mg by mouth 2 (two) times daily.), Disp: 30 tablet, Rfl: 11   cetirizine (ZYRTEC) 10 MG tablet, Take 10 mg by mouth daily as needed for allergies., Disp: , Rfl:    Doxylamine Succinate, Sleep, (UNISOM SLEEPTABS PO), Take 1  tablet by mouth at bedtime., Disp: , Rfl:    fluticasone (FLONASE) 50 MCG/ACT nasal spray, Place 2 sprays into both nostrils daily as needed for allergies or rhinitis., Disp: , Rfl:    guaiFENesin (MUCINEX) 600 MG 12 hr tablet, Take 600 mg by mouth daily as needed for cough., Disp: , Rfl:    ibuprofen (ADVIL,MOTRIN) 200 MG tablet, Take 400 mg by mouth every 6 (six) hours as needed for moderate pain., Disp: , Rfl:    NASONEX 50 MCG/ACT nasal spray, USE 2 SPRAYS IN EACH NOSTRIL ONCE DAILY AS NEEDED, Disp: 17 g, Rfl: 3  No Known Allergies Review of Systems Objective:  There were no vitals filed for this visit.  General: Well developed, nourished, in no acute distress, alert and oriented x3   Dermatological: Skin is warm, dry and supple bilateral. Nails x 10 are well maintained; remaining integument appears unremarkable at this time. There are no open sores, no preulcerative lesions, no rash or signs of infection present.  Vascular: Dorsalis Pedis artery and Posterior Tibial artery pedal pulses are 2/4 bilateral with immedate capillary fill time. Pedal hair growth present. No varicosities and no lower extremity edema present bilateral.   Neruologic: Grossly intact via light touch bilateral. Vibratory intact via tuning fork bilateral. Protective threshold with Semmes Wienstein monofilament intact to all pedal sites  bilateral. Patellar and Achilles deep tendon reflexes 2+ bilateral. No Babinski or clonus noted bilateral.   Musculoskeletal: No gross boney pedal deformities bilateral. No pain, crepitus, or limitation noted with foot and ankle range of motion bilateral. Muscular strength 5/5 in all groups tested bilateral.  No pain on palpation of the foot other than the heel at the plantar fascial cannula insertion site.  Gait: Unassisted, Nonantalgic.    Radiographs:  Radiographs taken by Dr. Alba Cory office were reviewed today in detail demonstrating only some soft tissue swelling no acute  findings.  Assessment & Plan:   Assessment: Planter fasciitis with some ankle edema.  Plan: Kenalog injection 10 mg left heel discussed appropriate shoe gear stretching exercise ice therapy and shoe gear modifications.     Adam Ballard, [place]

## 2021-07-01 DIAGNOSIS — N200 Calculus of kidney: Secondary | ICD-10-CM | POA: Diagnosis not present

## 2021-07-07 ENCOUNTER — Ambulatory Visit (INDEPENDENT_AMBULATORY_CARE_PROVIDER_SITE_OTHER): Payer: Federal, State, Local not specified - PPO | Admitting: Psychology

## 2021-07-07 DIAGNOSIS — F4322 Adjustment disorder with anxiety: Secondary | ICD-10-CM

## 2021-07-12 ENCOUNTER — Encounter: Payer: Self-pay | Admitting: Family Medicine

## 2021-07-12 MED ORDER — AMLODIPINE BESYLATE 5 MG PO TABS: 5.0000 mg | ORAL_TABLET | Freq: Every day | ORAL | 1 refills | Status: DC

## 2021-07-29 ENCOUNTER — Ambulatory Visit (INDEPENDENT_AMBULATORY_CARE_PROVIDER_SITE_OTHER): Payer: Federal, State, Local not specified - PPO | Admitting: Psychology

## 2021-07-29 DIAGNOSIS — F4322 Adjustment disorder with anxiety: Secondary | ICD-10-CM | POA: Diagnosis not present

## 2021-07-29 NOTE — Progress Notes (Signed)
[facility] Counselor/Therapist Progress Note  Patient ID: Adam Ballard, MRN: [id number],    Date: 07/29/2021  Time Spent: 50 mins  Treatment Type: Individual Therapy  Reported Symptoms: Pt presents for follow up session, via webex video, due to virus outbreak.  Pt grants consent for session, stating that he is in his car with no one else present.  I shared with pt that I am in my office at home, with no one else here either.  Pt shares, "It has been wild.  Adam Ballard went for 2 wks and did not talk with mom because mom called Adam Ballard stupid."  Mental Status Exam: Appearance:  Casual     Behavior: Appropriate  Motor: Normal  Speech/Language:  Clear and Coherent  Affect: Appropriate  Mood: normal  Thought process: normal  Thought content:   WNL  Sensory/Perceptual disturbances:   WNL  Orientation: oriented to person, place, and time/date  Attention: Good  Concentration: Good  Memory: WNL  Fund of knowledge:  Good  Insight:   Good  Judgment:  Good  Impulse Control: Good   Risk Assessment: Danger to Self:  No Self-injurious Behavior: No Danger to Others: No Duty to Warn:no Physical Aggression / Violence:No  Access to Firearms a concern: No  Gang Involvement:No   Subjective: Pt shares that he is under a significant amount of stress in his life right now because of the passing of his dad and the strained relationship for Adam Ballard and his mom.  "Mom is completely freaked out about having to manage the checkbook.  She has a $[id number] life insurance policy proceeds and $[id number] in cash in the bank but she is convinced that she is going to run out of money.  Dealing with my mom is so hard; I do not believe she is taking her medicine right now."  Pt shares that his mom has a fever of 100.5 today.  She is 60 yo and has some ongoing health issues.  "I just don't know how long she can continue to live on her own but there is no way she can live with [place]; Adam Ballard will leave me if  that happened."  Pt shares his mom is "super obsessive about her BP and heart rate and other vital signs; she checks them every day."  "I think my mom needs attention right now and Adam Ballard sure counts the hours that I am with her."  Pt shares that his brother who is not married has stepped up and is helping out with their mom recently.  Pt shares he has talked with Adam Ballard about needing to help his mom through this transition of his dad's death and his mom now living on her own.  Pt also shares that Adam Ballard did not even help take care of her own mom; Betty's sister did that care taking.  Pt shares his mom can still drive well and find her way to where ever she needs to go; she can cook and clean her own home at this point.  Pt's father was an Optometrist and he took care of all the financial issues and was a stabilizing force for his mom.  We talked about how it makes sense that his mom is struggling right now and may be able to come out of it OK.  Encouraged pt to talk with Adam Ballard and be very plain with her about what he needs from her and to be insistent that he get that from her (Betty's fourth husband and pt's first wife).  Offered for pt to bring Adam Ballard to a future session in order to help her understand the need to support pt's mom right now.  Pt shares he and Adam Ballard continue to walk regularly and he plays Disc Golf regularly as self care activities.  We will meet again for a follow up session in 4 wks due to the holidays and pt's father's memorial service on 08/21/21.  Interventions: Cognitive Behavioral Therapy  Diagnosis:Adjustment disorder with anxious mood  Plan: Treatment Plan Strengths/Abilities:  Intelligent, Intuitive, Willing to participate in therapy Treatment Preferences:  Outpatient Individual Therapy Statement of Needs:  Patient is to use CBT, mindfulness and coping skills to help manage and/or decrease symptoms associated with his diagnosis. Symptoms:  Depressed/Irritable mood, worry, social  withdrawal Problems Addressed:  Depressive thoughts, Sadness, Sleep issues, etc. Long Term Goals:  Pt to reduce overall level, frequency, and intensity of the feelings of depression/anxiety as evidenced by decreased irritability, negative self talk, and helpless feelings from 6 to 7 days/week to 0 to 1 days/week, per client report, for at least 3 consecutive months. Short Term Goals:  Pt to verbally express understanding of the relationship between feelings of depression/anxiety and their impact on thinking patterns and behaviors.  Pt to verbalize an understanding of the role that distorted thinking plays in creating fears, excessive worry, and ruminations. Target Date:  07/30/2022 Frequency:  Bi-weekly Modality:  Cognitive Behavioral Therapy Interventions by Therapist:  Therapist will use CBT, Mindfulness exercises, Coping skills and Referrals, as needed by client. Client has verbally approved this treatment plan.  Ivan Anchors, [facility]

## 2021-08-13 ENCOUNTER — Ambulatory Visit: Payer: Federal, State, Local not specified - PPO | Admitting: Family Medicine

## 2021-08-13 ENCOUNTER — Other Ambulatory Visit: Payer: Self-pay

## 2021-08-13 ENCOUNTER — Encounter: Payer: Self-pay | Admitting: Family Medicine

## 2021-08-13 VITALS — BP 139/81 | HR 77 | Temp 98.5°F | Ht 69.5 in | Wt 182.5 lb

## 2021-08-13 DIAGNOSIS — R030 Elevated blood-pressure reading, without diagnosis of hypertension: Secondary | ICD-10-CM

## 2021-08-13 DIAGNOSIS — R5382 Chronic fatigue, unspecified: Secondary | ICD-10-CM | POA: Diagnosis not present

## 2021-08-13 DIAGNOSIS — E038 Other specified hypothyroidism: Secondary | ICD-10-CM | POA: Diagnosis not present

## 2021-08-13 DIAGNOSIS — R252 Cramp and spasm: Secondary | ICD-10-CM

## 2021-08-13 DIAGNOSIS — Z125 Encounter for screening for malignant neoplasm of prostate: Secondary | ICD-10-CM | POA: Diagnosis not present

## 2021-08-13 DIAGNOSIS — R5383 Other fatigue: Secondary | ICD-10-CM | POA: Insufficient documentation

## 2021-08-13 DIAGNOSIS — F430 Acute stress reaction: Secondary | ICD-10-CM

## 2021-08-13 DIAGNOSIS — N200 Calculus of kidney: Secondary | ICD-10-CM | POA: Diagnosis not present

## 2021-08-13 MED ORDER — METOPROLOL SUCCINATE ER 25 MG PO TB24: 25.0000 mg | ORAL_TABLET | Freq: Every day | ORAL | 1 refills | Status: DC

## 2021-08-13 NOTE — Progress Notes (Signed)
Subjective:    Patient ID: Adam Ballard, male    DOB: 1961-03-07, 60 y.o.   MRN: [id number]  This visit occurred during the SARS-CoV-2 public health emergency.  Safety protocols were in place, including screening questions prior to the visit, additional usage of staff PPE, and extensive cleaning of exam room while observing appropriate contact time as indicated for disinfecting solutions.   HPI Pt presents for f/u of stress reaction and fatigue/cramps and elevated BP  Wt Readings from Last 3 Encounters:  08/13/21 182 lb 8 oz (82.8 kg)  06/23/21 178 lb (80.7 kg)  06/21/21 179 lb 0.2 oz (81.2 kg)   26.56 kg/m  Feeling lousy   Father died in the middle of everything and his mother is going crazy Had kidney stone procedure/ very tough   (prefers lithotripsy)   Has some cramps in the backs of legs (calves more than upper legs) Wants to check some labs   Diet is not as good- has to eat out because of his mother  Holidays eats worse anyway   Had injection for foot trouble/ plantar fasciitis  Still some discomfort  This happened after his urology procedure   Stress reaction with anxiety  Sees Dennison Bulla for therapy Was on buspar- sedating (tolerated it ok alone but not with amlodipine)     Was on amlodipine for bp BP Readings from Last 3 Encounters:  08/13/21 139/81  06/23/21 (!) 160/86  06/21/21 (!) 148/83    This seemed to make him feel bad in combo with buspar    Lab Results  Component Value Date   TSH 3.75 04/06/2020   Free T4  Lab Results  Component Value Date   PSA 0.36 04/06/2020   PSA 0.39 01/16/2017   PSA 0.43 01/29/2016   Patient Active Problem List   Diagnosis Date Noted   Fatigue 08/13/2021   Leg cramps 08/13/2021   Left foot pain 06/23/2021   Stress reaction 04/06/2021   Heat exhaustion 04/06/2020   Dizziness 04/06/2020   Bilateral groin pain 11/18/2019   Elevated blood pressure reading without diagnosis of hypertension 11/18/2019    Flank pain 02/12/2019   Right upper quadrant abdominal pain 02/12/2019   Subclinical hypothyroidism 02/03/2016   Prostate cancer screening 01/29/2016   Tremor 05/29/2014   Routine general medical examination at a health care facility 03/13/2013   Colon cancer screening 03/13/2013   Hypersomnia with sleep apnea 07/01/2008   Hyperlipidemia 06/27/2008   Allergic rhinitis 06/27/2008   Low back pain 05/02/2007   Past Medical History:  Diagnosis Date   Allergic rhinitis    Allergy    Anxiety    Dementia ([place])    History of kidney stones    recur 11/00   Hyperlipidemia    Low back pain    Poison ivy    frequent   Sleep apnea    uses cpap   Past Surgical History:  Procedure Laterality Date   ARTHROSCOPIC REPAIR ACL Right 1998   twice repaired   BACK SURGERY  06/01/2011   L3-L4 fusion   COLONOSCOPY  2014   CYSTOSCOPY WITH RETROGRADE PYELOGRAM, URETEROSCOPY AND STENT PLACEMENT Right 06/21/2021   Procedure: CYSTOSCOPY WITH RETROGRADE PYELOGRAM, URETEROSCOPY AND STENT PLACEMENT;  Surgeon: Franchot Gallo, MD;  Location: [facility];  Service: Urology;  Laterality: Right;   DRE 03/04, 08/06     HOLMIUM LASER APPLICATION Right 32/01/7123   Procedure: HOLMIUM LASER APPLICATION;  Surgeon: Franchot Gallo, MD;  Location: [facility];  Service:  Urology;  Laterality: Right;   LITHOTRIPSY     TRIGGER FINGER RELEASE Left 1998   Social History   Tobacco Use   Smoking status: Never   Smokeless tobacco: Never   Tobacco comments:    Pt tried ciggarettes several years ago, never habitually smoked.   Vaping Use   Vaping Use: Never used  Substance Use Topics   Alcohol use: Yes    Alcohol/week: 14.0 standard drinks    Types: 14 Glasses of wine per week    Comment: 2-3 glasses daily per pt   Drug use: No   Family History  Problem Relation Age of Onset   Heart disease Father    Diabetes Father        DM2   Prostate cancer Father    Leukemia Maternal Grandfather    Heart disease Paternal  Grandfather        MI   Cirrhosis Maternal Aunt        non-alcoholic   Colon cancer Neg Hx    Esophageal cancer Neg Hx    Rectal cancer Neg Hx    Stomach cancer Neg Hx    No Known Allergies Current Outpatient Medications on File Prior to Visit  Medication Sig Dispense Refill   cetirizine (ZYRTEC) 10 MG tablet Take 10 mg by mouth daily as needed for allergies.     Doxylamine Succinate, Sleep, (UNISOM SLEEPTABS PO) Take 1 tablet by mouth at bedtime.     fluticasone (FLONASE) 50 MCG/ACT nasal spray Place 2 sprays into both nostrils daily as needed for allergies or rhinitis.     guaiFENesin (MUCINEX) 600 MG 12 hr tablet Take 600 mg by mouth daily as needed for cough.     ibuprofen (ADVIL,MOTRIN) 200 MG tablet Take 400 mg by mouth every 6 (six) hours as needed for moderate pain.     NASONEX 50 MCG/ACT nasal spray USE 2 SPRAYS IN EACH NOSTRIL ONCE DAILY AS NEEDED 17 g 3   amLODipine (NORVASC) 5 MG tablet Take 1 tablet (5 mg total) by mouth daily. (Patient not taking: Reported on 08/13/2021) 30 tablet 1   busPIRone (BUSPAR) 15 MG tablet Take 0.5 tablets (7.5 mg total) by mouth 2 (two) times daily. (Patient not taking: Reported on 08/13/2021) 30 tablet 11   No current facility-administered medications on file prior to visit.      Review of Systems  Constitutional:  Positive for fatigue. Negative for activity change, appetite change, fever and unexpected weight change.  HENT:  Negative for congestion, rhinorrhea, sore throat and trouble swallowing.   Eyes:  Negative for pain, redness, itching and visual disturbance.  Respiratory:  Negative for cough, chest tightness, shortness of breath and wheezing.   Cardiovascular:  Negative for chest pain and palpitations.  Gastrointestinal:  Negative for abdominal pain, blood in stool, constipation, diarrhea and nausea.  Endocrine: Negative for cold intolerance, heat intolerance, polydipsia and polyuria.  Genitourinary:  Negative for difficulty  urinating, dysuria, frequency and urgency.  Musculoskeletal:  Negative for arthralgias, joint swelling and myalgias.       Muscle spasms in legs  Skin:  Negative for pallor and rash.  Neurological:  Negative for dizziness, tremors, weakness, numbness and headaches.  Hematological:  Negative for adenopathy. Does not bruise/bleed easily.  Psychiatric/Behavioral:  Positive for dysphoric mood. Negative for decreased concentration and suicidal ideas. The patient is nervous/anxious.       Objective:   Physical Exam Constitutional:      General: He is not in acute  distress.    Appearance: Normal appearance. He is well-developed and normal weight. He is not ill-appearing.  HENT:     Head: Normocephalic and atraumatic.     Mouth/Throat:     Mouth: Mucous membranes are moist.  Eyes:     Conjunctiva/sclera: Conjunctivae normal.     Pupils: Pupils are equal, round, and reactive to light.  Neck:     Thyroid: No thyromegaly.     Vascular: No carotid bruit or JVD.  Cardiovascular:     Rate and Rhythm: Normal rate and regular rhythm.     Heart sounds: Normal heart sounds.    No gallop.  Pulmonary:     Effort: Pulmonary effort is normal. No respiratory distress.     Breath sounds: Normal breath sounds. No wheezing or rales.  Abdominal:     General: Bowel sounds are normal. There is no distension or abdominal bruit.     Palpations: Abdomen is soft. There is no mass.     Tenderness: There is no abdominal tenderness.  Musculoskeletal:     Cervical back: Normal range of motion and neck supple.     Right lower leg: No edema.     Left lower leg: No edema.  Lymphadenopathy:     Cervical: No cervical adenopathy.  Skin:    General: Skin is warm and dry.     Coloration: Skin is not jaundiced or pale.     Findings: No bruising or rash.  Neurological:     Mental Status: He is alert.     Coordination: Coordination normal.     Deep Tendon Reflexes: Reflexes are normal and symmetric. Reflexes normal.   Psychiatric:        Mood and Affect: Mood normal.          Assessment & Plan:   Problem List Items Addressed This Visit       Endocrine   Subclinical hypothyroidism    In setting of fatigue  TSH and FT4 ordered       Relevant Medications   metoprolol succinate (TOPROL-XL) 25 MG 24 hr tablet   Other Relevant Orders   TSH (Completed)   T4, free (Completed)     Other   Prostate cancer screening    psa today  No voiding symptoms       Relevant Orders   PSA (Completed)   Elevated blood pressure reading without diagnosis of hypertension    Not tolerant of amlodipine  Now bp is fair w/o medication  Good health habits Will continue to follow  Stress/anxiety may add  BP: 139/81        Relevant Orders   Comprehensive metabolic panel (Completed)   TSH (Completed)   Stress reaction    Did not tolerate buspar well when taking amlodipine (dizzy/sedated)  Would like to continue counseling for now  In future, may consider sertraline  Reviewed stressors/ coping techniques/symptoms/ support sources/ tx options and side effects in detail today Stressors are high and now with grief also      Fatigue - Primary    Suspect multifactorial in the setting of stress reaction  Labs ordered  Overall good health habits but off track with diet recently   Plans to continue counseling and good self care      Relevant Orders   Magnesium (Completed)   Comprehensive metabolic panel (Completed)   TSH (Completed)   VITAMIN D 25 Hydroxy (Vit-D Deficiency, Fractures) (Completed)   T4, free (Completed)   Vitamin B12 (Completed)  Leg cramps    New, mostly in lower legs  With fatigue/stress  Plan to continue walking/stretching  Labs ordered incl magnesium       Relevant Orders   Magnesium (Completed)   Comprehensive metabolic panel (Completed)   TSH (Completed)   VITAMIN D 25 Hydroxy (Vit-D Deficiency, Fractures) (Completed)   T4, free (Completed)   Vitamin B12  (Completed)

## 2021-08-13 NOTE — Patient Instructions (Addendum)
Labs today for fatigue and cramps  Psa for prostate screening   Let's watch your blood pressure   Take care of yourself  Try to stay hydrated  Get back away from of the processed food with sodium   Try metoprolol xl 25 mg daily for blood pressure/ it will lower heart rate a little and help anxiety also   Follow up in 1-2 months  Keep me posted in the meantime if needed

## 2021-08-14 LAB — COMPREHENSIVE METABOLIC PANEL
AG Ratio: 1.8 (calc) (ref 1.0–2.5)
ALT: 24 U/L (ref 9–46)
AST: 20 U/L (ref 10–35)
Albumin: 4.4 g/dL (ref 3.6–5.1)
Alkaline phosphatase (APISO): 59 U/L (ref 35–144)
BUN: 20 mg/dL (ref 7–25)
CO2: 27 mmol/L (ref 20–32)
Calcium: 9.9 mg/dL (ref 8.6–10.3)
Chloride: 107 mmol/L (ref 98–110)
Creat: 1.35 mg/dL (ref 0.70–1.35)
Globulin: 2.4 g/dL (calc) (ref 1.9–3.7)
Glucose, Bld: 117 mg/dL — ABNORMAL HIGH (ref 65–99)
Potassium: 4.6 mmol/L (ref 3.5–5.3)
Sodium: 143 mmol/L (ref 135–146)
Total Bilirubin: 0.5 mg/dL (ref 0.2–1.2)
Total Protein: 6.8 g/dL (ref 6.1–8.1)

## 2021-08-14 LAB — VITAMIN B12: Vitamin B-12: 283 pg/mL (ref 200–1100)

## 2021-08-14 LAB — MAGNESIUM: Magnesium: 2.1 mg/dL (ref 1.5–2.5)

## 2021-08-14 LAB — T4, FREE: Free T4: 1.2 ng/dL (ref 0.8–1.8)

## 2021-08-14 LAB — TSH: TSH: 3.26 mIU/L (ref 0.40–4.50)

## 2021-08-14 LAB — PSA: PSA: 0.34 ng/mL (ref <=4.00)

## 2021-08-14 LAB — VITAMIN D 25 HYDROXY (VIT D DEFICIENCY, FRACTURES): Vit D, 25-Hydroxy: 28 ng/mL — ABNORMAL LOW (ref 30–100)

## 2021-08-15 NOTE — Assessment & Plan Note (Signed)
New, mostly in lower legs  With fatigue/stress  Plan to continue walking/stretching  Labs ordered incl magnesium

## 2021-08-15 NOTE — Assessment & Plan Note (Signed)
Suspect multifactorial in the setting of stress reaction  Labs ordered  Overall good health habits but off track with diet recently   Plans to continue counseling and good self care

## 2021-08-15 NOTE — Assessment & Plan Note (Signed)
psa today  No voiding symptoms

## 2021-08-15 NOTE — Assessment & Plan Note (Signed)
Not tolerant of amlodipine  Now bp is fair w/o medication  Good health habits Will continue to follow  Stress/anxiety may add  BP: 139/81

## 2021-08-15 NOTE — Assessment & Plan Note (Signed)
Did not tolerate buspar well when taking amlodipine (dizzy/sedated)  Would like to continue counseling for now  In future, may consider sertraline  Reviewed stressors/ coping techniques/symptoms/ support sources/ tx options and side effects in detail today Stressors are high and now with grief also

## 2021-08-15 NOTE — Assessment & Plan Note (Signed)
In setting of fatigue  TSH and FT4 ordered

## 2021-08-27 ENCOUNTER — Ambulatory Visit (INDEPENDENT_AMBULATORY_CARE_PROVIDER_SITE_OTHER): Payer: Federal, State, Local not specified - PPO | Admitting: Psychology

## 2021-08-27 DIAGNOSIS — F4322 Adjustment disorder with anxiety: Secondary | ICD-10-CM

## 2021-08-27 NOTE — Progress Notes (Signed)
[facility] Counselor/Therapist Progress Note  Patient ID: Adam Ballard, MRN: [id number],    Date: 08/27/2021  Time Spent: 50 mins  Treatment Type: Individual Therapy  Reported Symptoms: Pt presents for follow up session, via webex video, due to virus outbreak.  Pt grants consent for session, stating that he is in his camper with no one else present.  I shared with pt that I am in my office at home, with no one else here either.  Pt shares, "We had dad's memorial service and that went better than I expected.  Mom did well with it and we had a good outing with the family afterwards.  Mom continues to be a challenge."  Mental Status Exam: Appearance:  Casual     Behavior: Appropriate  Motor: Normal  Speech/Language:  Clear and Coherent  Affect: Appropriate  Mood: normal  Thought process: normal  Thought content:   WNL  Sensory/Perceptual disturbances:   WNL  Orientation: oriented to person, place, and time/date  Attention: Good  Concentration: Good  Memory: WNL  Fund of knowledge:  Good  Insight:   Good  Judgment:  Good  Impulse Control: Good   Risk Assessment: Danger to Self:  No Self-injurious Behavior: No Danger to Others: No Duty to Warn:no Physical Aggression / Violence:No  Access to Firearms a concern: No  Gang Involvement:No   Subjective: Pt shares that his dad had two life insurance policies ($93X) and they have both paid out.  They are still working on getting his dad's pension assigned to his mom.  Pt shares, "Mom is still going around telling people that she has no money and is not going to be able to pay her bills.  I am praying that the pension will come through and the money will start flowing to her and that might help her feel better."  Encouraged pt to work it out for him to be able to see her financial accounts regularly to help keep an eye on them so she will not be taken advantage of.  We talked about how his mom is struggling with this now  because she feels out of control due to the sudden passing of her husband.  Pt shares that Adam Ballard continues to struggle with the time pt is spending with his mom.  Pt shares one of his brothers calls his mom every morning.  Pt shares that he is not sure he has had a chance to grieve his dad's passing yet; "I think I might have grieved him while he was in the hospital."  Pt shares that his mom went to the hospital due to a kidney infection since our last session.  Pt spent 3 days in the hospital recovering.  She has a follow up doctor's appt today for the kidney issue.  Pt shares that he and Adam Ballard "are doing OK right now."  They continue to walk regularly and he is still playing disc golf and is enjoying that.  Pt shares that there is an upcoming stretch: Valentine's Day, dad's birthday on 2/15 and mom and dad's anniversary on 2/17.  Encouraged pt to continue with his self care activities and to talk with his mom about connecting with a grief counselor.  We will meet in 4 wks for a follow up session.    Interventions: Cognitive Behavioral Therapy  Diagnosis:Adjustment disorder with anxious mood  Plan: Treatment Plan Strengths/Abilities:  Intelligent, Intuitive, Willing to participate in therapy Treatment Preferences:  Outpatient Individual Therapy Statement of Needs:  Patient is to use CBT, mindfulness and coping skills to help manage and/or decrease symptoms associated with his diagnosis. Symptoms:  Depressed/Irritable mood, worry, social withdrawal Problems Addressed:  Depressive thoughts, Sadness, Sleep issues, etc. Long Term Goals:  Pt to reduce overall level, frequency, and intensity of the feelings of depression/anxiety as evidenced by decreased irritability, negative self talk, and helpless feelings from 6 to 7 days/week to 0 to 1 days/week, per client report, for at least 3 consecutive months.  Progress: 40% Short Term Goals:  Pt to verbally express understanding of the relationship between  feelings of depression/anxiety and their impact on thinking patterns and behaviors.  Pt to verbalize an understanding of the role that distorted thinking plays in creating fears, excessive worry, and ruminations. Progress: 40% Target Date:  07/30/2022 Frequency:  Bi-weekly Modality:  Cognitive Behavioral Therapy Interventions by Therapist:  Therapist will use CBT, Mindfulness exercises, Coping skills and Referrals, as needed by client. Client has verbally approved this treatment plan.  Ivan Anchors, [facility]

## 2021-09-17 ENCOUNTER — Encounter: Payer: Self-pay | Admitting: Family Medicine

## 2021-09-17 ENCOUNTER — Ambulatory Visit (INDEPENDENT_AMBULATORY_CARE_PROVIDER_SITE_OTHER): Payer: Federal, State, Local not specified - PPO | Admitting: Family Medicine

## 2021-09-17 ENCOUNTER — Other Ambulatory Visit: Payer: Self-pay

## 2021-09-17 VITALS — BP 135/80 | HR 58 | Temp 97.1°F | Ht 69.75 in | Wt 182.0 lb

## 2021-09-17 DIAGNOSIS — E7800 Pure hypercholesterolemia, unspecified: Secondary | ICD-10-CM

## 2021-09-17 DIAGNOSIS — Z125 Encounter for screening for malignant neoplasm of prostate: Secondary | ICD-10-CM

## 2021-09-17 DIAGNOSIS — Z0000 Encounter for general adult medical examination without abnormal findings: Secondary | ICD-10-CM | POA: Diagnosis not present

## 2021-09-17 DIAGNOSIS — F430 Acute stress reaction: Secondary | ICD-10-CM

## 2021-09-17 DIAGNOSIS — E038 Other specified hypothyroidism: Secondary | ICD-10-CM

## 2021-09-17 DIAGNOSIS — I10 Essential (primary) hypertension: Secondary | ICD-10-CM

## 2021-09-17 DIAGNOSIS — R030 Elevated blood-pressure reading, without diagnosis of hypertension: Secondary | ICD-10-CM | POA: Diagnosis not present

## 2021-09-17 DIAGNOSIS — Z1211 Encounter for screening for malignant neoplasm of colon: Secondary | ICD-10-CM

## 2021-09-17 DIAGNOSIS — R000 Tachycardia, unspecified: Secondary | ICD-10-CM | POA: Insufficient documentation

## 2021-09-17 LAB — LIPID PANEL
Cholesterol: 255 mg/dL — ABNORMAL HIGH (ref <200)
HDL: 85 mg/dL (ref >=40)
LDL Cholesterol (Calc): 154 mg/dL (calc) — ABNORMAL HIGH
Non-HDL Cholesterol (Calc): 170 mg/dL (calc) — ABNORMAL HIGH (ref <130)
Total CHOL/HDL Ratio: 3 (calc) (ref <5.0)
Triglycerides: 69 mg/dL (ref <150)

## 2021-09-17 NOTE — Progress Notes (Signed)
Subjective:    Patient ID: Adam Ballard, male    DOB: Mar 10, 1961, 61 y.o.   MRN: [id number]  This visit occurred during the SARS-CoV-2 public health emergency.  Safety protocols were in place, including screening questions prior to the visit, additional usage of staff PPE, and extensive cleaning of exam room while observing appropriate contact time as indicated for disinfecting solutions.   HPI Here for health maintenance exam and to review chronic medical problems    Wt Readings from Last 3 Encounters:  09/17/21 182 lb (82.6 kg)  08/13/21 182 lb 8 oz (82.8 kg)  06/23/21 178 lb (80.7 kg)   26.30 kg/m  Things have gotten better in terms of his mother's financial issues after loosing his father  Just had 10 tons of rocks delivered to start hauling -looking forward to doing that   Wears a fit bit - has a relatively new one  Noted in times of stress pulse occ went up I the 180s  Does not correspond to exercise  Does not do it for long  Cannot feel it when it happens   Works in Occupational hygienist  Exercise tolerance is fine  Pressure to please wife and his mother    Zoster status  :  may be interested in shingrix  Covid immunized/boosted in may  Tdap 01/2016 Flu shot 04/2021  Colonoscopy 12/18 with 5 y recall  Prostate health Lab Results  Component Value Date   PSA 0.34 08/13/2021   PSA 0.36 04/06/2020   PSA 0.39 01/16/2017   Urination is starting to improve after last kidney stone procedure   H/o labile bp This has been better  BP Readings from Last 3 Encounters:  09/17/21 135/80  08/13/21 139/81  06/23/21 (!) 160/86    Pulse Readings from Last 3 Encounters:  09/17/21 (!) 58  08/13/21 77  06/23/21 75   Took 1/2 of the metoprolol xl 25 mg      (slowed him down too much at a full dose) Takes 1/2 pill at bedtime   Bp is very good at home    627O-350 systolic   EKG today NSR rate 52   H/o subclinical hypothyroidism Lab Results  Component Value Date    TSH 3.26 08/13/2021  Free T4 nl at 1.2  Hyperlipidemia Lab Results  Component Value Date   CHOL 241 (H) 04/06/2020   HDL 81.30 04/06/2020   LDLCALC 147 (H) 04/06/2020   LDLDIRECT 138.8 12/30/2008   TRIG 60.0 04/06/2020   CHOLHDL 3 04/06/2020   Needs labs  Diet is fair /not always perfect  Very little beef  Does not always eat regularly    Mood Seeing Dr Clovis Pu for therapy -may be able to back off that as things get better    Vit D is low at 28    Lab Results  Component Value Date   VITAMINB12 283 08/13/2021   Magnesium level was nl   Patient Active Problem List   Diagnosis Date Noted   Tachycardia 09/17/2021   Fatigue 08/13/2021   Leg cramps 08/13/2021   Stress reaction 04/06/2021   Bilateral groin pain 11/18/2019   Essential hypertension 11/18/2019   Subclinical hypothyroidism 02/03/2016   Prostate cancer screening 01/29/2016   Tremor 05/29/2014   Routine general medical examination at a health care facility 03/13/2013   Colon cancer screening 03/13/2013   Hypersomnia with sleep apnea 07/01/2008   Hyperlipidemia 06/27/2008   Allergic rhinitis 06/27/2008   Past Medical History:  Diagnosis Date   Allergic rhinitis    Allergy    Anxiety    Dementia ([place])    History of kidney stones    recur 11/00   Hyperlipidemia    Low back pain    Poison ivy    frequent   Sleep apnea    uses cpap   Past Surgical History:  Procedure Laterality Date   ARTHROSCOPIC REPAIR ACL Right 1998   twice repaired   BACK SURGERY  06/01/2011   L3-L4 fusion   COLONOSCOPY  2014   [place] Medicine, URETEROSCOPY AND STENT PLACEMENT Right 06/21/2021   Procedure: CYSTOSCOPY WITH RETROGRADE PYELOGRAM, URETEROSCOPY AND STENT PLACEMENT;  Surgeon: Franchot Gallo, MD;  Location: [facility];  Service: Urology;  Laterality: Right;   DRE 03/04, 08/06     HOLMIUM LASER APPLICATION Right 95/09/8411   Procedure: HOLMIUM LASER APPLICATION;  Surgeon: Franchot Gallo,  MD;  Location: [facility];  Service: Urology;  Laterality: Right;   LITHOTRIPSY     TRIGGER FINGER RELEASE Left 1998   Social History   Tobacco Use   Smoking status: Never   Smokeless tobacco: Never   Tobacco comments:    Pt tried ciggarettes several years ago, never habitually smoked.   Vaping Use   Vaping Use: Never used  Substance Use Topics   Alcohol use: Yes    Alcohol/week: 14.0 standard drinks    Types: 14 Glasses of wine per week    Comment: 2-3 glasses daily per pt   Drug use: No   Family History  Problem Relation Age of Onset   Heart disease Father    Diabetes Father        DM2   Prostate cancer Father    Leukemia Maternal Grandfather    Heart disease Paternal Grandfather        MI   Cirrhosis Maternal Aunt        non-alcoholic   Colon cancer Neg Hx    Esophageal cancer Neg Hx    Rectal cancer Neg Hx    Stomach cancer Neg Hx    No Known Allergies Current Outpatient Medications on File Prior to Visit  Medication Sig Dispense Refill   cetirizine (ZYRTEC) 10 MG tablet Take 10 mg by mouth daily as needed for allergies.     Doxylamine Succinate, Sleep, (UNISOM SLEEPTABS PO) Take 1 tablet by mouth at bedtime.     fluticasone (FLONASE) 50 MCG/ACT nasal spray Place 2 sprays into both nostrils daily as needed for allergies or rhinitis.     guaiFENesin (MUCINEX) 600 MG 12 hr tablet Take 600 mg by mouth daily as needed for cough.     ibuprofen (ADVIL,MOTRIN) 200 MG tablet Take 400 mg by mouth every 6 (six) hours as needed for moderate pain.     NASONEX 50 MCG/ACT nasal spray USE 2 SPRAYS IN EACH NOSTRIL ONCE DAILY AS NEEDED 17 g 3   No current facility-administered medications on file prior to visit.    Review of Systems  Constitutional:  Positive for fatigue. Negative for activity change, appetite change, fever and unexpected weight change.  HENT:  Negative for congestion, rhinorrhea, sore throat and trouble swallowing.   Eyes:  Negative for pain, redness, itching and  visual disturbance.  Respiratory:  Negative for cough, chest tightness, shortness of breath and wheezing.   Cardiovascular:  Negative for chest pain and palpitations.  Gastrointestinal:  Negative for abdominal pain, blood in stool, constipation, diarrhea and nausea.  Endocrine: Negative for cold  intolerance, heat intolerance, polydipsia and polyuria.  Genitourinary:  Negative for difficulty urinating, dysuria, frequency and urgency.  Musculoskeletal:  Negative for arthralgias, joint swelling and myalgias.       Less leg cramps   Skin:  Negative for pallor and rash.  Neurological:  Negative for dizziness, tremors, weakness, numbness and headaches.  Hematological:  Negative for adenopathy. Does not bruise/bleed easily.  Psychiatric/Behavioral:  Negative for decreased concentration and dysphoric mood. The patient is not nervous/anxious.        Stress is a little better      Objective:   Physical Exam Constitutional:      General: He is not in acute distress.    Appearance: Normal appearance. He is well-developed and normal weight. He is not ill-appearing or diaphoretic.  HENT:     Head: Normocephalic and atraumatic.     Right Ear: Tympanic membrane, ear canal and external ear normal.     Left Ear: Tympanic membrane, ear canal and external ear normal.     Nose: Nose normal. No congestion.     Mouth/Throat:     Mouth: Mucous membranes are moist.     Pharynx: Oropharynx is clear. No posterior oropharyngeal erythema.  Eyes:     General: No scleral icterus.       Right eye: No discharge.        Left eye: No discharge.     Conjunctiva/sclera: Conjunctivae normal.     Pupils: Pupils are equal, round, and reactive to light.  Neck:     Thyroid: No thyromegaly.     Vascular: No carotid bruit or JVD.  Cardiovascular:     Rate and Rhythm: Normal rate and regular rhythm.     Pulses: Normal pulses.     Heart sounds: Normal heart sounds.    No gallop.  Pulmonary:     Effort: Pulmonary effort  is normal. No respiratory distress.     Breath sounds: Normal breath sounds. No wheezing or rales.     Comments: Good air exch Chest:     Chest wall: No tenderness.  Abdominal:     General: Bowel sounds are normal. There is no distension or abdominal bruit.     Palpations: Abdomen is soft. There is no mass.     Tenderness: There is no abdominal tenderness.     Hernia: No hernia is present.  Musculoskeletal:        General: No tenderness.     Cervical back: Normal range of motion and neck supple. No rigidity. No muscular tenderness.     Right lower leg: No edema.     Left lower leg: No edema.     Comments: No acute joint changes   Lymphadenopathy:     Cervical: No cervical adenopathy.  Skin:    General: Skin is warm and dry.     Coloration: Skin is not pale.     Findings: No erythema or rash.     Comments: Solar lentigines diffusely   Neurological:     Mental Status: He is alert.     Cranial Nerves: No cranial nerve deficit.     Motor: No abnormal muscle tone.     Coordination: Coordination normal.     Gait: Gait normal.     Deep Tendon Reflexes: Reflexes are normal and symmetric. Reflexes normal.  Psychiatric:        Mood and Affect: Mood normal.        Cognition and Memory: Cognition and memory normal.  Assessment & Plan:   Problem List Items Addressed This Visit       Cardiovascular and Mediastinum   Essential hypertension    Some fatigue from toprol xl 50-pt has been cutting in 1/2 He would like to go without it and check bp to see if needed now that stress is down  BP: 135/80  inst to hold med and continue working on lifestyle change  If not well controlled may consider re trial of amlodipine at 5 mg or toprol at 12.5 (explained that splitting an ER product is not optimal)  Labs reviewed          Endocrine   Subclinical hypothyroidism    TSH and FT4 are in the normal range this check         Other   Colon cancer screening    Colonoscopy  07/2017 with 5 y recall  Due 07/2022      Hyperlipidemia    Disc goals for lipids and reasons to control them Rev last labs with pt Rev low sat fat diet in detail Labs drawn  Diet is fair/unchanged       Relevant Orders   Lipid panel (Completed)   Prostate cancer screening    psa ordered  No family h/o prostate cancer No new urinary changes       Routine general medical examination at a health care facility - Primary    Reviewed health habits including diet and exercise and skin cancer prevention Reviewed appropriate screening tests for age  Also reviewed health mt list, fam hx and immunization status , as well as social and family history   See HPI Labs reviewed  Plans to check coverage of shingrix  Colonoscopy is up to date  psa added to labs       Stress reaction    Continues counseling  No medication  Some decrease in stress  Encouraged self care

## 2021-09-17 NOTE — Patient Instructions (Addendum)
If you are interested in the shingles vaccine series (Shingrix), call your insurance or pharmacy to check on coverage and location it must be given.  If affordable - you can schedule it here or at your pharmacy depending on coverage   Take care of yourself   Get vitamin D3 over the counter and take 2000 iu   We will consider a cardiology visit   If you get chest discomfort or shortness of breath or anything new let us know (go to ER if concerned)   Lab today for cholesterol   Hold your blood pressure medicine If the BP at home stays over 140/90 please let us know and I would consider metoprolol (short acting) 12.5 mg twice daily

## 2021-09-19 NOTE — Assessment & Plan Note (Signed)
Disc goals for lipids and reasons to control them Rev last labs with pt Rev low sat fat diet in detail Labs drawn  Diet is fair/unchanged

## 2021-09-19 NOTE — Assessment & Plan Note (Signed)
Continues counseling  No medication  Some decrease in stress  Encouraged self care

## 2021-09-19 NOTE — Assessment & Plan Note (Signed)
Reviewed health habits including diet and exercise and skin cancer prevention Reviewed appropriate screening tests for age  Also reviewed health mt list, fam hx and immunization status , as well as social and family history   See HPI Labs reviewed  Plans to check coverage of shingrix  Colonoscopy is up to date  psa added to labs

## 2021-09-19 NOTE — Assessment & Plan Note (Signed)
Colonoscopy 07/2017 with 5 y recall  Due 07/2022

## 2021-09-19 NOTE — Assessment & Plan Note (Signed)
psa ordered  No family h/o prostate cancer No new urinary changes

## 2021-09-19 NOTE — Assessment & Plan Note (Signed)
TSH and FT4 are in the normal range this check

## 2021-09-19 NOTE — Assessment & Plan Note (Signed)
Some episodic inc in HR noted on his fit bit  He does not feel it  Nl EKG today  May benefit from a monitor

## 2021-09-19 NOTE — Assessment & Plan Note (Addendum)
Some fatigue from toprol xl 50-pt has been cutting in 1/2 He would like to go without it and check bp to see if needed now that stress is down  BP: 135/80  inst to hold med and continue working on lifestyle change  If not well controlled may consider re trial of amlodipine at 5 mg or toprol at 12.5 (explained that splitting an ER product is not optimal)  Labs reviewed

## 2021-09-30 ENCOUNTER — Telehealth: Payer: Self-pay | Admitting: Family Medicine

## 2021-09-30 DIAGNOSIS — R000 Tachycardia, unspecified: Secondary | ICD-10-CM

## 2021-09-30 NOTE — Telephone Encounter (Signed)
Referral to cardiology for tachycardia

## 2021-10-08 ENCOUNTER — Ambulatory Visit: Payer: Federal, State, Local not specified - PPO | Admitting: Family Medicine

## 2021-10-08 ENCOUNTER — Ambulatory Visit (INDEPENDENT_AMBULATORY_CARE_PROVIDER_SITE_OTHER): Payer: Federal, State, Local not specified - PPO | Admitting: Psychology

## 2021-10-08 DIAGNOSIS — F4322 Adjustment disorder with anxiety: Secondary | ICD-10-CM

## 2021-10-08 NOTE — Progress Notes (Signed)
[facility] Counselor/Therapist Progress Note  Patient ID: Adam Ballard, MRN: [id number],    Date: 10/08/2021  Time Spent: 50 mins  Treatment Type: Individual Therapy  Reported Symptoms: Pt presents for follow up session, via webex video, due to virus outbreak.  Pt grants consent for session, stating that he is in his camper with no one else present.  I shared with pt that I am in my office at home, with no one else here either.  Pt shares, "I have been doing a little bit better.  I tend to do worse during the shorter days of the winter and then all of the stuff that happened with dad passing away recently.  Last week was Valentine's Day, their 76th wedding anniversary, and my dad's birthday and that was tough for mom."  Mental Status Exam: Appearance:  Casual     Behavior: Appropriate  Motor: Normal  Speech/Language:  Clear and Coherent  Affect: Appropriate  Mood: normal  Thought process: normal  Thought content:   WNL  Sensory/Perceptual disturbances:   WNL  Orientation: oriented to person, place, and time/date  Attention: Good  Concentration: Good  Memory: WNL  Fund of knowledge:  Good  Insight:   Good  Judgment:  Good  Impulse Control: Good   Risk Assessment: Danger to Self:  No Self-injurious Behavior: No Danger to Others: No Duty to Warn:no Physical Aggression / Violence:No  Access to Firearms a concern: No  Gang Involvement:No   Subjective: Pt shares that his mom "did better than I thought she would do during last week."  Pt shares that they have collected on all of his dad's insurance policies and his pension and his mom is feeling much better about her financial situation.  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  He again mentions that they also volunteer at the [employer] and they get in free to the tournament as well (11 yrs so far).  Pt shares that his mom is doing OK at this point; "she is still lonely and would like for [place] to visit more often.  I am encouraging her to meet new people in the community.  She has also expressed some interest in seeing a grief counselor."  Talked with pt about options for grief counseling through local Hospice organizations.  Pt shares that he continues to play disc golf and regular golf and continues to walk regularly and has been working around his property.  Pt shares that he noticed that his FitBit was recording that his heart rate was getting up to about 190 bpm during the day.  He talked with his doctor and he has been checked out and will also be seeing a cardiologist in March.  Pt feels like he is doing well at this time; he will call the office to reschedule a follow up session.     Interventions: Cognitive Behavioral Therapy  Diagnosis:Adjustment disorder with anxious mood  Plan: Treatment Plan Strengths/Abilities:  Intelligent, Intuitive, Willing to participate in therapy Treatment Preferences:  Outpatient Individual Therapy Statement of Needs:  Patient is to use CBT, mindfulness and coping skills to help manage and/or decrease symptoms associated  with his diagnosis. Symptoms:  Depressed/Irritable mood, worry, social withdrawal Problems Addressed:  Depressive thoughts, Sadness, Sleep issues, etc. Long Term Goals:  Pt to reduce overall level, frequency, and intensity of the feelings of depression/anxiety as evidenced by decreased irritability, negative self talk, and helpless feelings from 6 to 7 days/week to 0 to 1 days/week, per client report, for at least 3 consecutive months.  Progress: 40% Short Term Goals:  Pt to verbally express understanding of the relationship between feelings of depression/anxiety and  their impact on thinking patterns and behaviors.  Pt to verbalize an understanding of the role that distorted thinking plays in creating fears, excessive worry, and ruminations. Progress: 40% Target Date:  07/30/2022 Frequency:  Bi-weekly Modality:  Cognitive Behavioral Therapy Interventions by Therapist:  Therapist will use CBT, Mindfulness exercises, Coping skills and Referrals, as needed by client. Client has verbally approved this treatment plan.  Ivan Anchors, [facility]

## 2021-11-09 ENCOUNTER — Ambulatory Visit: Payer: Federal, State, Local not specified - PPO | Admitting: Cardiovascular Disease

## 2021-11-09 ENCOUNTER — Encounter: Payer: Self-pay | Admitting: Cardiovascular Disease

## 2021-11-09 ENCOUNTER — Other Ambulatory Visit: Payer: Self-pay

## 2021-11-09 VITALS — BP 158/82 | HR 54 | Ht 70.0 in | Wt 184.2 lb

## 2021-11-09 DIAGNOSIS — I10 Essential (primary) hypertension: Secondary | ICD-10-CM | POA: Diagnosis not present

## 2021-11-09 DIAGNOSIS — E782 Mixed hyperlipidemia: Secondary | ICD-10-CM

## 2021-11-09 DIAGNOSIS — I479 Paroxysmal tachycardia, unspecified: Secondary | ICD-10-CM | POA: Diagnosis not present

## 2021-11-09 NOTE — Patient Instructions (Addendum)
Continue to use phone for pulse monitoring ?If having tachycardia, think about [employer] ?Or call the office , we have the Zio monitor ? ?Read CT coronary calcium score, $99 ? ?Medication Instructions:  ?No changes ? ?If you need a refill on your cardiac medications before your next appointment, please call your pharmacy.  ? ?Lab work: ?No new labs needed ? ?Testing/Procedures: ?No new testing needed ? ?Follow-Up: ?At [facility], you and your health needs are our priority.  As part of our continuing mission to provide you with exceptional heart care, we have created designated Provider Care Teams.  These Care Teams include your primary Cardiologist (physician) and Advanced Practice Providers (APPs -  Physician Assistants and Nurse Practitioners) who all work together to provide you with the care you need, when you need it. ? ?You will need a follow up appointment as needed ? ?Providers on your designated Care Team:   ?Murray Hodgkins, NP ?Christell Faith, PA-C ?Cadence Kathlen Mody, PA-C ? ?COVID-19 Vaccine Information can be found at: [email or web address] For questions related to vaccine distribution or appointments, please email [email or web address] or call [phone].  ? ?

## 2021-11-09 NOTE — Progress Notes (Signed)
Cardiology Office Note ? ?Date:  11/10/2021  ? ?ID:  Tawni Levy, DOB [birth year], MRN [id number] ? ?PCP:  Abner Greenspan, MD  ? ?Chief Complaint  ?Patient presents with  ? New Patient (Initial Visit)  ?  Ref by Dr. Derrel Nip tachycardia. Medications reviewed by the patient verbally.   ? ? ?HPI:  ?Mr. Adam Ballard is a 61 year old gentleman with past medical history of ?Hyperlipidemia, family history cardiac disease ?Referred by Dr. Glori Bickers for consultation of his paroxysmal tachycardia, hyperlipidemia ? ?Reports he is in good health, ?Nonsmopker ?No diabetes ?Very active at baseline, lots of yard work ? ?Reports having very stressful November, December ?Father passed nov 2022 ?Managed his affairs in December ?In that setting noted elevated heart rates on his wristwatch ?Unable to pull up EKGs only that the pulse remained high for unclear length of time ?He was not symptomatic ? ?Reports that he stopped metoprolol ?Has not had any further elevated heart rate since that time ? ?Blood pressure elevated on today's visit 150s ?Heart rate 54 on no metoprolol ?Does not check his blood pressure at home ? ?EKG personally reviewed by myself on todays visit ?Sinus bradycardia rate 54 bpm no significant ST-T wave changes ? ? ?PMH:   has a past medical history of Allergic rhinitis, Allergy, Anxiety, Dementia ([place]), History of kidney stones, Hyperlipidemia, Low back pain, Poison ivy, and Sleep apnea. ? ?PSH:    ?Past Surgical History:  ?Procedure Laterality Date  ? ARTHROSCOPIC REPAIR ACL Right 1998  ? twice repaired  ? BACK SURGERY  06/01/2011  ? L3-L4 fusion  ? COLONOSCOPY  2014  ? CYSTOSCOPY WITH RETROGRADE PYELOGRAM, URETEROSCOPY AND STENT PLACEMENT Right 06/21/2021  ? Procedure: CYSTOSCOPY WITH RETROGRADE PYELOGRAM, URETEROSCOPY AND STENT PLACEMENT;  Surgeon: Franchot Gallo, MD;  Location: [facility];  Service: Urology;  Laterality: Right;  ? DRE 03/04, 08/06    ? HOLMIUM LASER APPLICATION Right 44/04/2009  ? Procedure: HOLMIUM  LASER APPLICATION;  Surgeon: Franchot Gallo, MD;  Location: [facility];  Service: Urology;  Laterality: Right;  ? LITHOTRIPSY    ? TRIGGER FINGER RELEASE Left 1998  ? ? ?Current Outpatient Medications  ?Medication Sig Dispense Refill  ? cetirizine (ZYRTEC) 10 MG tablet Take 10 mg by mouth daily as needed for allergies.    ? Doxylamine Succinate, Sleep, (UNISOM SLEEPTABS PO) Take 1 tablet by mouth at bedtime.    ? fluticasone (FLONASE) 50 MCG/ACT nasal spray Place 2 sprays into both nostrils daily as needed for allergies or rhinitis.    ? guaiFENesin (MUCINEX) 600 MG 12 hr tablet Take 600 mg by mouth daily as needed for cough.    ? ibuprofen (ADVIL,MOTRIN) 200 MG tablet Take 400 mg by mouth every 6 (six) hours as needed for moderate pain.    ? NASONEX 50 MCG/ACT nasal spray USE 2 SPRAYS IN EACH NOSTRIL ONCE DAILY AS NEEDED 17 g 3  ? ?No current facility-administered medications for this visit.  ? ? ? ?Allergies:   Patient has no known allergies.  ? ?Social History:  The patient  reports that he has never smoked. He has never used smokeless tobacco. He reports current alcohol use of about 14.0 standard drinks per week. He reports that he does not use drugs.  ? ?Family History:   family history includes Arrhythmia in his father; Cirrhosis in his maternal aunt; Diabetes in his father; Heart disease in his paternal grandfather; Hypertension in his mother; Leukemia in his maternal grandfather; Prostate cancer in his father;  Stroke in his father.  ? ? ?Review of Systems: ?Review of Systems  ?Constitutional: Negative.   ?HENT: Negative.    ?Respiratory: Negative.    ?Cardiovascular: Negative.   ?Gastrointestinal: Negative.   ?Musculoskeletal: Negative.   ?Neurological: Negative.   ?Psychiatric/Behavioral: Negative.    ?All other systems reviewed and are negative. ? ? ?PHYSICAL EXAM: ?VS:  BP (!) 158/82 (BP Location: Right Arm, Patient Position: Sitting, Cuff Size: Normal)   Pulse (!) 54   Ht '5\' 10"'$  (1.778 m)   Wt 184 lb  4 oz (83.6 kg)   SpO2 99%   BMI 26.44 kg/m?  , BMI Body mass index is 26.44 kg/m?. ?GEN: Well nourished, well developed, in no acute distress ?HEENT: normal ?Neck: no JVD, carotid bruits, or masses ?Cardiac: RRR; no murmurs, rubs, or gallops,no edema  ?Respiratory:  clear to auscultation bilaterally, normal work of breathing ?GI: soft, nontender, nondistended, + BS ?MS: no deformity or atrophy ?Skin: warm and dry, no rash ?Neuro:  Strength and sensation are intact ?Psych: euthymic mood, full affect ? ?Recent Labs: ?08/13/2021: ALT 24; BUN 20; Creat 1.35; Magnesium 2.1; Potassium 4.6; Sodium 143; TSH 3.26  ? ? ?Lipid Panel ?Lab Results  ?Component Value Date  ? CHOL 255 (H) 09/17/2021  ? HDL 85 09/17/2021  ? LDLCALC 154 (H) 09/17/2021  ? TRIG 69 09/17/2021  ? ?  ? ?Wt Readings from Last 3 Encounters:  ?11/09/21 184 lb 4 oz (83.6 kg)  ?09/17/21 182 lb (82.6 kg)  ?08/13/21 182 lb 8 oz (82.8 kg)  ?  ? ? ?ASSESSMENT AND PLAN: ? ?Problem List Items Addressed This Visit   ? ?  ? Cardiology Problems  ? Hyperlipidemia  ? Essential hypertension  ? Relevant Orders  ? EKG 12-Lead  ? ?Other Visit Diagnoses   ? ? Paroxysmal tachycardia ([place])    -  Primary  ? ?  ? ?Tachycardia ?Etiology unclear, wristwatch not particularly accurate to determine type of tachycardia, length of tachycardia ?Certainly may have had clustering of atrial tachycardia/SVT though details unclear as it does not produce a rhythm strip ?-Discussed other modalities for monitoring including cardia mobile device, a better watch such as Apple Watch is able to record EKGs ?-For any worsening symptoms could order Zio monitor ?He reports no further tachycardia through 9163 ?-Certainly possible elevated rate in the setting of profound stress, lost his father November 2022, was dealing with his affairs ?-Less likely related to the metoprolol which he has since held ? ?Essential hypertension ?Recommend he closely monitor blood pressure at home ?Was reasonable on prior  clinic visit with Dr. Glori Bickers ?No plans to start additional medications at this time ? ?Hyperlipidemia ?Most concerning are the elevated numbers ?Family history ?Discussed screening studies such as CT coronary calcium scoring, he is not interested at this time ?Details provided, recommend he call us if he would like a screening.  This could also be ordered through primary care ?Elevated calcium score would likely warrant more aggressive management of his hyperlipidemia ? ? ? Total encounter time more than 60 minutes ? Greater than 50% was spent in counseling and coordination of care with the patient ? ?Patient seen in consultation for Dr. Glori Bickers and will be referred back to her office for ongoing care of the issues detailed above ? ?Signed, ?Esmond Plants, M.D., Ph.D. ?[facility] [place], [place] ?[phone] ?

## 2022-01-26 ENCOUNTER — Encounter: Payer: Self-pay | Admitting: Family Medicine

## 2022-01-26 ENCOUNTER — Ambulatory Visit (INDEPENDENT_AMBULATORY_CARE_PROVIDER_SITE_OTHER)
Admission: RE | Admit: 2022-01-26 | Discharge: 2022-01-26 | Disposition: A | Discharge status: Home / Self Care | Payer: Federal, State, Local not specified - PPO | Source: Ambulatory Visit | Attending: Family Medicine | Admitting: Family Medicine

## 2022-01-26 ENCOUNTER — Ambulatory Visit: Payer: Federal, State, Local not specified - PPO | Admitting: Family Medicine

## 2022-01-26 VITALS — BP 132/70 | HR 54 | Temp 98.3°F | Ht 69.75 in | Wt 184.1 lb

## 2022-01-26 DIAGNOSIS — M19011 Primary osteoarthritis, right shoulder: Secondary | ICD-10-CM | POA: Diagnosis not present

## 2022-01-26 DIAGNOSIS — G8929 Other chronic pain: Secondary | ICD-10-CM

## 2022-01-26 DIAGNOSIS — M25511 Pain in right shoulder: Secondary | ICD-10-CM

## 2022-01-26 NOTE — Progress Notes (Signed)
Vineeth Fell T. Kristen Bushway, MD, [facility] [place], [address]  Phone: [phone]  FAX: [phone]  Adam Ballard - 61 y.o. male  MRN [id number]  Date of Birth: [birth year]  Date: 01/26/2022  PCP: Abner Greenspan, MD  Referral: Abner Greenspan, MD  Chief Complaint  Patient presents with   Shoulder Pain    Right   Subjective:   Adam Ballard is a 61 y.o. very pleasant male patient with Body mass index is 26.6 kg/m. who presents with the following:  R shoulder pain:  2 years, will be off and on with some pain.  He will have some intermittent aching, some pain deep in the shoulder as well as some pain with rotational movement.  IROM will hurt quite a bit.  Some pain in abduction.  No really injury at all.  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   No old trauma. No fx, dislocation, or prior surgery.  Has not tried much of anything.   When it happens, used to be only short-lived, but now it has become more progressive over time.   LHD.  XR, check for OA.  Right-sided shoulder x-ray There is only some minimal degenerative change at the glenohumeral joint and mild degenerative changes acromioclavicular joint, but overall the joint space is fairly well-preserved. Independent review by myself.  Review of Systems is noted in the HPI, as appropriate  Objective:   BP 132/70   Pulse (!) 54   Temp 98.3 F (36.8 C) (Oral)   Ht 5' 9.75" (1.772 m)   Wt 184 lb 1 oz (83.5 kg)   SpO2 98%   BMI 26.60 kg/m   GEN: No acute distress; alert,appropriate. PULM: Breathing comfortably in no respiratory distress PSYCH: Normally interactive.    Shoulder: R Inspection: No muscle wasting or winging Ecchymosis/edema: neg  AC joint, scapula, clavicle: NT Cervical spine: NT, full ROM Spurling's: neg Abduction: full, 5/5 Flexion: full, 5/5 IR,  5/5, but he lacks 40 degrees of contralateral motion compared to the contralateral side.  Roughly 20 degrees of rotational movement. ER at neutral: full, 5/5 AC crossover and compression: neg Neer: neg Hawkins: neg Drop Test: neg Empty Can: neg Supraspinatus insertion: NT Bicipital groove: NT Speed's: neg Yergason's: neg Sulcus sign: neg Scapular dyskinesis: none C5-T1 intact Sensation intact Grip 5/5   Laboratory and Imaging Data:  Assessment and Plan:     ICD-10-CM   1. Chronic right shoulder pain  M25.511 DG Shoulder Right   G89.29      Fairly reassuring exam with some minimal glenohumeral arthritis.  I think that this does not meet the true definition of a adhesive capsulitis, but there is some  GIRD (Glenohumeral Internal Rotational Deficiency)   I gave him some range of motion work, and I think that he works on particularly his external/internal rotational movements that his pain will improve slowly over time.  Aside from this over-the-counter medicine such as Tylenol and NSAIDs would be reasonable and appropriate.  If his symptoms persist longer term, there is likely some mild impingement phenomenon due to some scapular dyskinesis, so we could consider formal physical therapy.  Medication Management during today's office visit: No orders of the defined types were placed in this encounter.  There are no discontinued medications.  Orders placed today for conditions managed today: Orders Placed  This Encounter  Procedures   DG Shoulder Right    Follow-up if needed: No follow-ups on file.  [employer] One speech-to-text software was used for transcription in this dictation.  Possible transcriptional errors can occur using Editor, commissioning.   Signed,  Maud Deed. Contina Strain, MD   Outpatient Encounter Medications as of 01/26/2022  Medication Sig   cetirizine (ZYRTEC) 10 MG tablet Take 10 mg by mouth daily as needed for allergies.   Doxylamine Succinate, Sleep,  (UNISOM SLEEPTABS PO) Take 1 tablet by mouth at bedtime.   fluticasone (FLONASE) 50 MCG/ACT nasal spray Place 2 sprays into both nostrils daily as needed for allergies or rhinitis.   guaiFENesin (MUCINEX) 600 MG 12 hr tablet Take 600 mg by mouth daily as needed for cough.   ibuprofen (ADVIL,MOTRIN) 200 MG tablet Take 400 mg by mouth every 6 (six) hours as needed for moderate pain.   NASONEX 50 MCG/ACT nasal spray USE 2 SPRAYS IN EACH NOSTRIL ONCE DAILY AS NEEDED   No facility-administered encounter medications on file as of 01/26/2022.

## 2022-10-26 ENCOUNTER — Encounter: Payer: Self-pay | Admitting: Gastroenterology

## 2022-12-19 DIAGNOSIS — Z125 Encounter for screening for malignant neoplasm of prostate: Secondary | ICD-10-CM | POA: Diagnosis not present

## 2022-12-19 DIAGNOSIS — N200 Calculus of kidney: Secondary | ICD-10-CM | POA: Diagnosis not present

## 2023-03-09 IMAGING — DX DG SHOULDER 2+V*R*
3 series · 3 of 3 positions shown · non-contrast
Comparison: None Available.

CLINICAL DATA: Restricted motion of the right shoulder for 2 years.

EXAM:
RIGHT SHOULDER - 2+ VIEW

[shoulder (grashey view) ap]
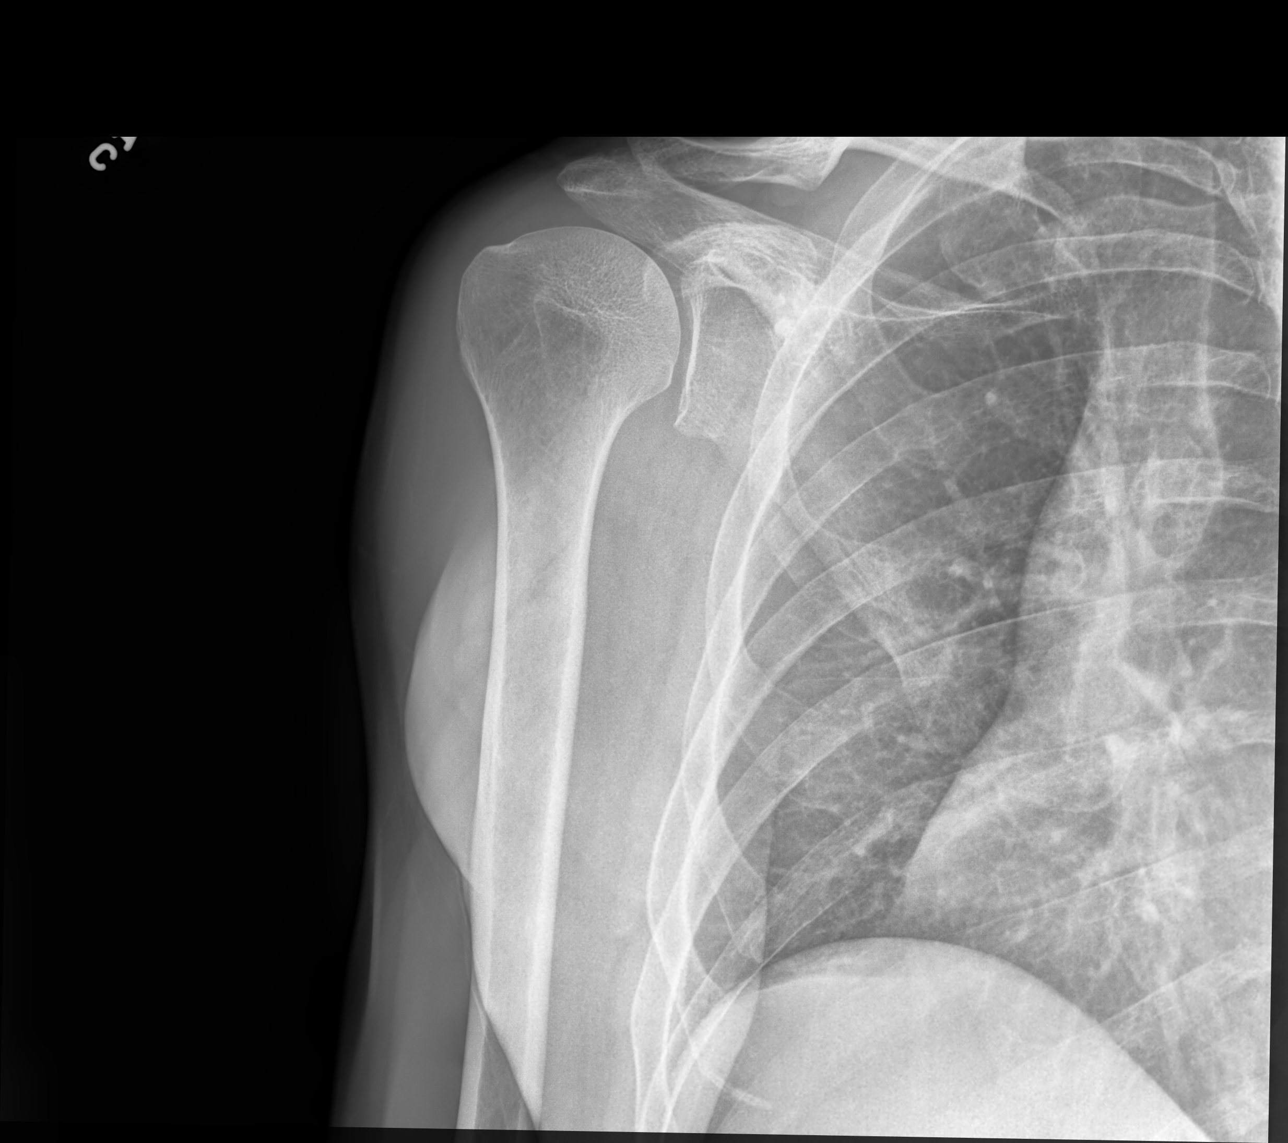

[shoulder (y view) lat]
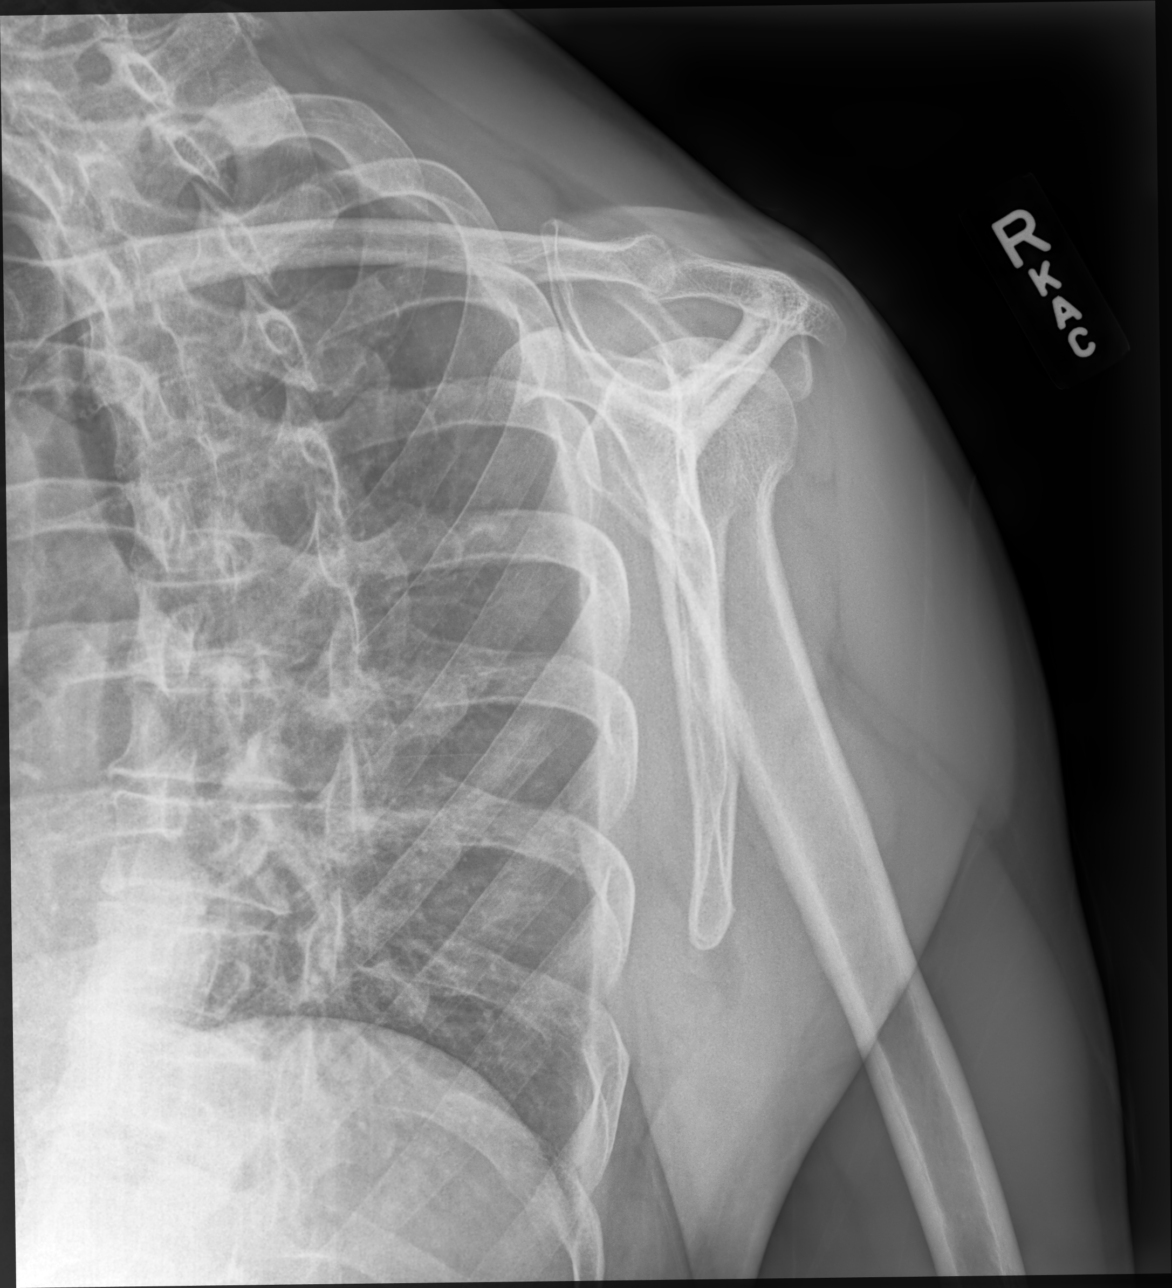

[shoulder axial]
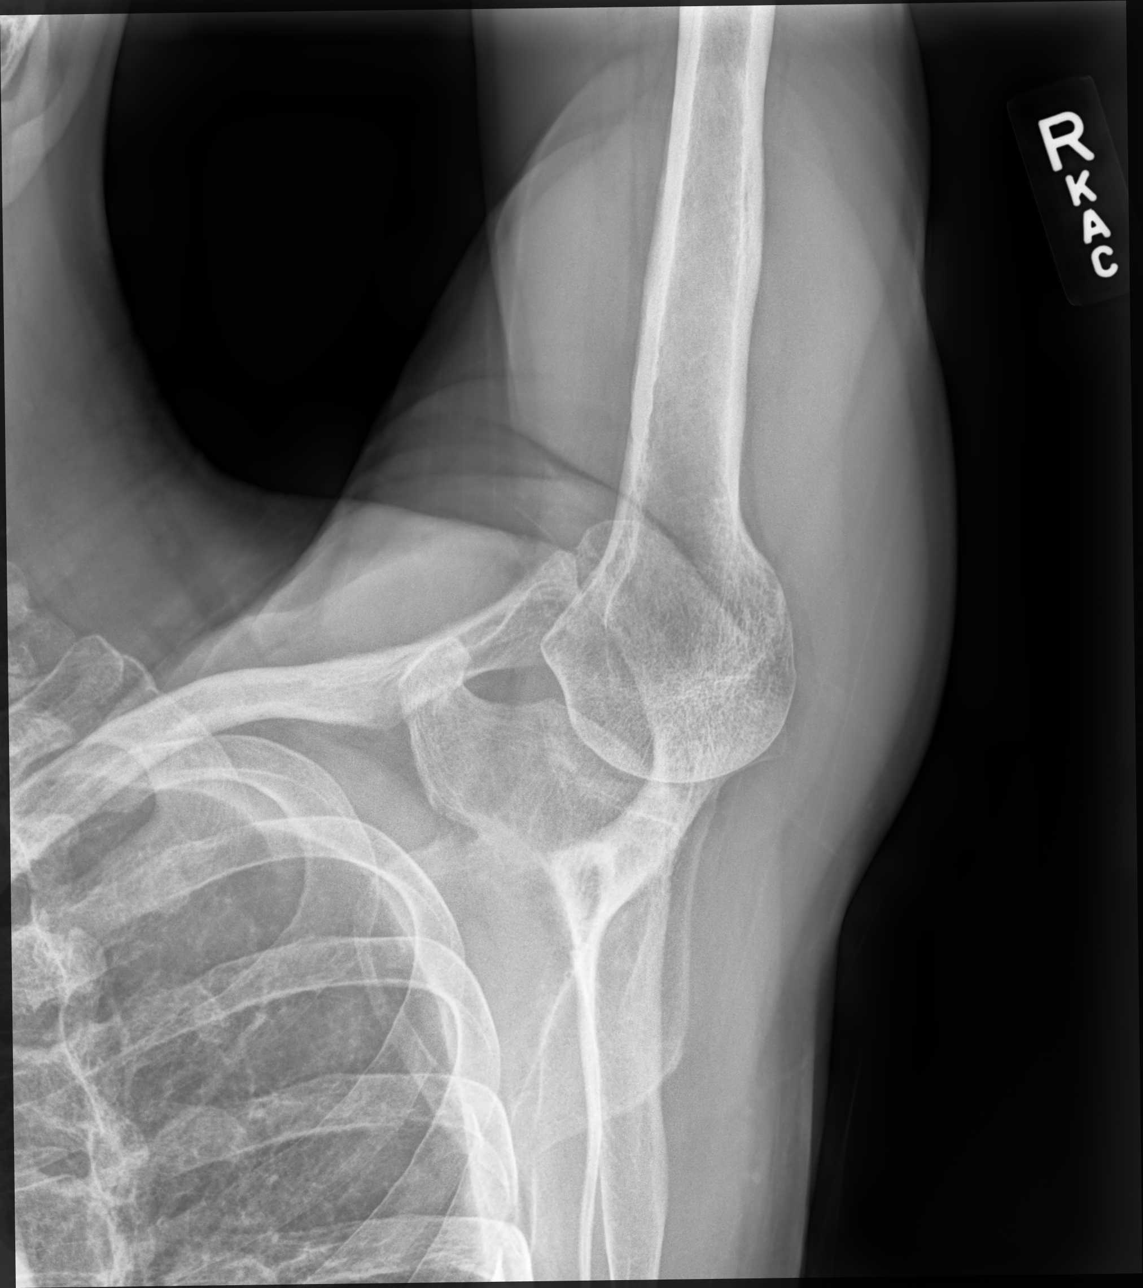

[3 of 3 positions shown; findings below may reference images not displayed]

FINDINGS: There is no evidence of fracture or dislocation. Minimal
degenerative joint changes of the right acromioclavicular joint with
minimal sclerosis of distal clavicle noted. Soft tissues are
unremarkable.
IMPRESSION: Minimal degenerative joint changes of the right acromioclavicular
joint.

## 2024-01-09 DIAGNOSIS — N400 Enlarged prostate without lower urinary tract symptoms: Secondary | ICD-10-CM | POA: Diagnosis not present

## 2024-01-09 DIAGNOSIS — N200 Calculus of kidney: Secondary | ICD-10-CM | POA: Diagnosis not present

## 2024-02-13 ENCOUNTER — Ambulatory Visit: Payer: Self-pay | Admitting: Family Medicine

## 2024-02-13 ENCOUNTER — Ambulatory Visit (INDEPENDENT_AMBULATORY_CARE_PROVIDER_SITE_OTHER): Admitting: Family Medicine

## 2024-02-13 VITALS — BP 141/81 | HR 46 | Temp 97.7°F | Ht 69.25 in | Wt 183.2 lb

## 2024-02-13 DIAGNOSIS — Z125 Encounter for screening for malignant neoplasm of prostate: Secondary | ICD-10-CM

## 2024-02-13 DIAGNOSIS — E559 Vitamin D deficiency, unspecified: Secondary | ICD-10-CM | POA: Diagnosis not present

## 2024-02-13 DIAGNOSIS — E038 Other specified hypothyroidism: Secondary | ICD-10-CM

## 2024-02-13 DIAGNOSIS — Z23 Encounter for immunization: Secondary | ICD-10-CM

## 2024-02-13 DIAGNOSIS — I10 Essential (primary) hypertension: Secondary | ICD-10-CM | POA: Diagnosis not present

## 2024-02-13 DIAGNOSIS — Z0000 Encounter for general adult medical examination without abnormal findings: Secondary | ICD-10-CM

## 2024-02-13 DIAGNOSIS — R519 Headache, unspecified: Secondary | ICD-10-CM | POA: Insufficient documentation

## 2024-02-13 DIAGNOSIS — Z1159 Encounter for screening for other viral diseases: Secondary | ICD-10-CM | POA: Diagnosis not present

## 2024-02-13 DIAGNOSIS — G4710 Hypersomnia, unspecified: Secondary | ICD-10-CM

## 2024-02-13 DIAGNOSIS — R739 Hyperglycemia, unspecified: Secondary | ICD-10-CM | POA: Diagnosis not present

## 2024-02-13 DIAGNOSIS — Z114 Encounter for screening for human immunodeficiency virus [HIV]: Secondary | ICD-10-CM

## 2024-02-13 DIAGNOSIS — E7800 Pure hypercholesterolemia, unspecified: Secondary | ICD-10-CM | POA: Diagnosis not present

## 2024-02-13 DIAGNOSIS — G4730 Sleep apnea, unspecified: Secondary | ICD-10-CM

## 2024-02-13 DIAGNOSIS — R5382 Chronic fatigue, unspecified: Secondary | ICD-10-CM

## 2024-02-13 DIAGNOSIS — Z1211 Encounter for screening for malignant neoplasm of colon: Secondary | ICD-10-CM

## 2024-02-13 LAB — COMPREHENSIVE METABOLIC PANEL WITH GFR
ALT: 23 U/L (ref 0–53)
AST: 20 U/L (ref 0–37)
Albumin: 4.6 g/dL (ref 3.5–5.2)
Alkaline Phosphatase: 51 U/L (ref 39–117)
BUN: 18 mg/dL (ref 6–23)
CO2: 30 meq/L (ref 19–32)
Calcium: 9.3 mg/dL (ref 8.4–10.5)
Chloride: 104 meq/L (ref 96–112)
Creatinine, Ser: 1.04 mg/dL (ref 0.40–1.50)
GFR: 76.39 mL/min (ref >60.00)
Glucose, Bld: 104 mg/dL — ABNORMAL HIGH (ref 70–99)
Potassium: 4.7 meq/L (ref 3.5–5.1)
Sodium: 140 meq/L (ref 135–145)
Total Bilirubin: 0.8 mg/dL (ref 0.2–1.2)
Total Protein: 6.9 g/dL (ref 6.0–8.3)

## 2024-02-13 LAB — CBC WITH DIFFERENTIAL/PLATELET
Basophils Absolute: 0.1 10*3/uL (ref 0.0–0.1)
Basophils Relative: 0.9 % (ref 0.0–3.0)
Eosinophils Absolute: 0.2 10*3/uL (ref 0.0–0.7)
Eosinophils Relative: 2.9 % (ref 0.0–5.0)
HCT: 41.2 % (ref 39.0–52.0)
Hemoglobin: 13.7 g/dL (ref 13.0–17.0)
Lymphocytes Relative: 23.8 % (ref 12.0–46.0)
Lymphs Abs: 1.5 10*3/uL (ref 0.7–4.0)
MCHC: 33.3 g/dL (ref 30.0–36.0)
MCV: 89.1 fl (ref 78.0–100.0)
Monocytes Absolute: 0.5 10*3/uL (ref 0.1–1.0)
Monocytes Relative: 7.6 % (ref 3.0–12.0)
Neutro Abs: 4.1 10*3/uL (ref 1.4–7.7)
Neutrophils Relative %: 64.8 % (ref 43.0–77.0)
Platelets: 265 10*3/uL (ref 150.0–400.0)
RBC: 4.63 Mil/uL (ref 4.22–5.81)
RDW: 13.4 % (ref 11.5–15.5)
WBC: 6.4 10*3/uL (ref 4.0–10.5)

## 2024-02-13 LAB — LIPID PANEL
Cholesterol: 245 mg/dL — ABNORMAL HIGH (ref 0–200)
HDL: 81.6 mg/dL (ref >39.00)
LDL Cholesterol: 153 mg/dL — ABNORMAL HIGH (ref 0–99)
NonHDL: 163.78
Total CHOL/HDL Ratio: 3
Triglycerides: 56 mg/dL (ref 0.0–149.0)
VLDL: 11.2 mg/dL (ref 0.0–40.0)

## 2024-02-13 LAB — TSH: TSH: 3.79 u[IU]/mL (ref 0.35–5.50)

## 2024-02-13 LAB — VITAMIN D 25 HYDROXY (VIT D DEFICIENCY, FRACTURES): VITD: 36.07 ng/mL (ref 30.00–100.00)

## 2024-02-13 LAB — HEMOGLOBIN A1C: Hgb A1c MFr Bld: 6 % (ref 4.6–6.5)

## 2024-02-13 LAB — PSA: PSA: 0.32 ng/mL (ref 0.10–4.00)

## 2024-02-13 MED ORDER — LOSARTAN POTASSIUM 50 MG PO TABS: 50.0000 mg | ORAL_TABLET | Freq: Every day | ORAL | 1 refills | Status: DC

## 2024-02-13 NOTE — Assessment & Plan Note (Signed)
 Needs to follow up with pulmonary  Unable to keep mask on  Has day time somnolence , and loud snoring  Referred to discuss further  Discussed risks of untreated osa

## 2024-02-13 NOTE — Patient Instructions (Addendum)
 Keep up the good exercise  Add some strength training to your routine, this is important for bone and brain health and can reduce your risk of falls and help your body use insulin properly and regulate weight  Light weights, exercise bands , and internet videos are a good way to start  Yoga (chair or regular), machines , floor exercises or a gym with machines are also good options   Labs today  When labs return I will likely start you on some mild blood pressure medicine-this should help headache   Take care of yourself   Tetanus shot today    Please call and schedule your colonoscopy  [facility]  [phone]   I put the referral in for pulmonary for sleep apnea  Please let us  know if you don't hear in 1-2 weeks to set that up You can also call to schedule ( pulmonary)

## 2024-02-13 NOTE — Progress Notes (Signed)
 Subjective:    Patient ID: Adam Ballard, male    DOB: [birth year], 63 y.o.   MRN: [id number]  HPI  Here for health maintenance exam and to review chronic medical problems   Wt Readings from Last 3 Encounters:  02/13/24 183 lb 4 oz (83.1 kg)  01/26/22 184 lb 1 oz (83.5 kg)  11/09/21 184 lb 4 oz (83.6 kg)   26.87 kg/m  Vitals:   02/13/24 0953 02/13/24 1024  BP: (!) 144/82 (!) 141/81  Pulse: (!) 46   Temp: 97.7 F (36.5 C)   SpO2: 100%     Immunization History  Administered Date(s) Administered   Influenza,inj,Quad PF,6+ Mos 04/22/2021   PFIZER Comirnaty (Gray Top)Covid-19 Tri-Sucrose Vaccine 12/28/2020   PFIZER(Purple Top)SARS-COV-2 Vaccination 11/15/2019, 12/09/2019   Td 03/15/2005, 02/13/2024   Tdap 02/03/2016    Health Maintenance Due  Topic Date Due   HIV Screening  Never done   Hepatitis C Screening  Never done   Colonoscopy  08/11/2022   Interested in hep C and HIV screen   Had a cat bite from vaccinated cat  Is better  On left hand -cleaned it well  It did not get infected     Prostate health Lab Results  Component Value Date   PSA 0.32 02/13/2024   PSA 0.34 08/13/2021   PSA 0.36 04/06/2020  No change in urination     Colon cancer screening  - 2018 with 5 y recall  Was unable to get it due to no driver    Bone health  PH  Falls- none  Fractures-none  Supplements - multi vit with D  Last vitamin D Lab Results  Component Value Date   VD25OH 36.07 02/13/2024    Exercise  Lots Disk golf , walking  Works hard outdoors / hauling very heavy stuff regularly    Prone to muscle cramps- better recently  Takes liquid IV supplement if sweating  Trying to stay hydrated   Mood    02/13/2024    9:58 AM 04/06/2021    8:44 AM 04/06/2020    1:50 PM 02/03/2017    3:37 PM  Depression screen PHQ 2/9  Decreased Interest 0 2 0 0  Down, Depressed, Hopeless 0 2 0 0  PHQ - 2 Score 0 4 0 0  Altered sleeping 0 2  1  Tired, decreased energy 3 2   1   Change in appetite 0 1  1  Feeling bad or failure about yourself  0 2  0  Trouble concentrating 0 0  0  Moving slowly or fidgety/restless 0 0  0  Suicidal thoughts 0 1  0  PHQ-9 Score 3 12  3   Difficult doing work/chores Not difficult at all Somewhat difficult     Some stress with family vernetta- worries about mother also    Hyperlipidemia Lab Results  Component Value Date   CHOL 245 (H) 02/13/2024   HDL 81.60 02/13/2024   LDLCALC 153 (H) 02/13/2024   LDLDIRECT 138.8 12/30/2008   TRIG 56.0 02/13/2024   CHOLHDL 3 02/13/2024   Diet controlled   Eating way less red meat  Less fried foods and chips  More veggies and fruits  Snacks on veggies and fruits    Lab Results  Component Value Date   VITAMINB12 283 08/13/2021   Having some headaches  Very mild headache = constant  Middle /top of head  Not bad enough to take anything for  Thought it was his glasses -so he  got new ones and did make difference  Working on hydration  Not throbbing  Occational muscle twitching in legs - but not pain   Blood pressure mildly elevated today BP Readings from Last 3 Encounters:  02/13/24 (!) 141/81  01/26/22 132/70  11/09/21 (!) 158/82   Lots of HTN in family   Pulse Readings from Last 3 Encounters:  02/13/24 (!) 46  01/26/22 (!) 54  11/09/21 (!) 54   Pulse is on the low side today Not dizzy Very active   Very rarely takes an nsaid   Has OSA  Is unable to keep mask on  Nods off during the day Snoring      Patient Active Problem List   Diagnosis Date Noted   Encounter for hepatitis C screening test for low risk patient 02/13/2024   Encounter for screening for HIV 02/13/2024   Elevated random blood glucose level 02/13/2024   Vitamin D deficiency 02/13/2024   Mild headache 02/13/2024   Fatigue 08/13/2021   Leg cramps 08/13/2021   Stress reaction 04/06/2021   Essential hypertension 11/18/2019   Subclinical hypothyroidism 02/03/2016   Prostate cancer screening  01/29/2016   Routine general medical examination at a health care facility 03/13/2013   Colon cancer screening 03/13/2013   Hypersomnia with sleep apnea 07/01/2008   Hyperlipidemia 06/27/2008   Allergic rhinitis 06/27/2008   Past Medical History:  Diagnosis Date   Allergic rhinitis    Allergy    Anxiety    Dementia (HCC)    History of kidney stones    recur 11/00   Hyperlipidemia    Low back pain    Poison ivy    frequent   Sleep apnea    uses cpap   Past Surgical History:  Procedure Laterality Date   ARTHROSCOPIC REPAIR ACL Right 1998   twice repaired   BACK SURGERY  06/01/2011   L3-L4 fusion   COLONOSCOPY  2014   CYSTOSCOPY WITH RETROGRADE PYELOGRAM, URETEROSCOPY AND STENT PLACEMENT Right 06/21/2021   Procedure: CYSTOSCOPY WITH RETROGRADE PYELOGRAM, URETEROSCOPY AND STENT PLACEMENT;  Surgeon: Matilda Senior, MD;  Location: [facility];  Service: Urology;  Laterality: Right;   DRE 03/04, 08/06     HOLMIUM LASER APPLICATION Right 06/21/2021   Procedure: HOLMIUM LASER APPLICATION;  Surgeon: Matilda Senior, MD;  Location: [facility];  Service: Urology;  Laterality: Right;   LITHOTRIPSY     TRIGGER FINGER RELEASE Left 1998   Social History   Tobacco Use   Smoking status: Never   Smokeless tobacco: Never   Tobacco comments:    Pt tried ciggarettes several years ago, never habitually smoked.   Vaping Use   Vaping status: Never Used  Substance Use Topics   Alcohol use: Yes    Alcohol/week: 14.0 standard drinks of alcohol    Types: 14 Glasses of wine per week    Comment: 2-3 glasses daily per pt   Drug use: No   Family History  Problem Relation Age of Onset   Hypertension Mother    Arrhythmia Father        A-Fib   Diabetes Father        DM2   Prostate cancer Father    Stroke Father    Cirrhosis Maternal Aunt        non-alcoholic   Leukemia Maternal Grandfather    Heart disease Paternal Grandfather        MI   Colon cancer Neg Hx    Esophageal cancer Neg Hx  Rectal cancer Neg Hx    Stomach cancer Neg Hx    No Known Allergies Current Outpatient Medications on File Prior to Visit  Medication Sig Dispense Refill   Doxylamine Succinate, Sleep, (UNISOM SLEEPTABS PO) Take 1 tablet by mouth at bedtime.     ibuprofen (ADVIL,MOTRIN) 200 MG tablet Take 400 mg by mouth every 6 (six) hours as needed for moderate pain.     No current facility-administered medications on file prior to visit.    Review of Systems  Constitutional:  Positive for fatigue. Negative for activity change, appetite change, fever and unexpected weight change.  HENT:  Negative for congestion, rhinorrhea, sore throat and trouble swallowing.   Eyes:  Negative for pain, redness, itching and visual disturbance.  Respiratory:  Negative for cough, chest tightness, shortness of breath and wheezing.   Cardiovascular:  Negative for chest pain and palpitations.  Gastrointestinal:  Negative for abdominal pain, blood in stool, constipation, diarrhea and nausea.  Endocrine: Negative for cold intolerance, heat intolerance, polydipsia and polyuria.  Genitourinary:  Negative for difficulty urinating, dysuria, frequency and urgency.  Musculoskeletal:  Negative for arthralgias, joint swelling and myalgias.  Skin:  Negative for pallor and rash.       Recent cat bite , healed   Neurological:  Positive for headaches. Negative for dizziness, tremors, syncope, facial asymmetry, speech difficulty, weakness, light-headedness and numbness.  Hematological:  Negative for adenopathy. Does not bruise/bleed easily.  Psychiatric/Behavioral:  Negative for decreased concentration and dysphoric mood. The patient is not nervous/anxious.        Stressors        Objective:   Physical Exam Constitutional:      General: He is not in acute distress.    Appearance: Normal appearance. He is well-developed and normal weight. He is not ill-appearing or diaphoretic.  HENT:     Head: Normocephalic and atraumatic.      Right Ear: Tympanic membrane, ear canal and external ear normal.     Left Ear: Tympanic membrane, ear canal and external ear normal.     Nose: Nose normal. No congestion.     Mouth/Throat:     Mouth: Mucous membranes are moist.     Pharynx: Oropharynx is clear. No posterior oropharyngeal erythema.   Eyes:     General: No scleral icterus.       Right eye: No discharge.        Left eye: No discharge.     Conjunctiva/sclera: Conjunctivae normal.     Pupils: Pupils are equal, round, and reactive to light.   Neck:     Thyroid : No thyromegaly.     Vascular: No carotid bruit or JVD.   Cardiovascular:     Rate and Rhythm: Normal rate and regular rhythm.     Pulses: Normal pulses.     Heart sounds: Normal heart sounds.     No gallop.  Pulmonary:     Effort: Pulmonary effort is normal. No respiratory distress.     Breath sounds: Normal breath sounds. No wheezing or rales.     Comments: Good air exch Chest:     Chest wall: No tenderness.  Abdominal:     General: Bowel sounds are normal. There is no distension or abdominal bruit.     Palpations: Abdomen is soft. There is no mass.     Tenderness: There is no abdominal tenderness.     Hernia: No hernia is present.   Musculoskeletal:        General: No  tenderness.     Cervical back: Normal range of motion and neck supple. No rigidity. No muscular tenderness.     Right lower leg: No edema.     Left lower leg: No edema.  Lymphadenopathy:     Cervical: No cervical adenopathy.   Skin:    General: Skin is warm and dry.     Coloration: Skin is not pale.     Findings: No erythema or rash.     Comments: Solar lentigines diffusely  Scab from cat bite on thenar area of left hand  No erythema or swelling    Neurological:     Mental Status: He is alert.     Cranial Nerves: No cranial nerve deficit.     Motor: No abnormal muscle tone.     Coordination: Coordination normal.     Gait: Gait normal.     Deep Tendon Reflexes: Reflexes are  normal and symmetric. Reflexes normal.   Psychiatric:        Mood and Affect: Mood normal.        Cognition and Memory: Cognition and memory normal.           Assessment & Plan:   Problem List Items Addressed This Visit       Cardiovascular and Mediastinum   Essential hypertension   Blood pressure is elevated  No current medicines  Has family history Some mild headache- may be cause    BP Readings from Last 1 Encounters:  02/13/24 (!) 141/81   No changes needed Most recent labs reviewed  Disc lifstyle change with low sodium diet and exercise   Pending lab to start medication  Would avoid rate lowering medicines        Relevant Orders   Lipid Panel (Completed)   Comprehensive metabolic panel with GFR (Completed)   CBC with Differential/Platelet (Completed)   TSH (Completed)     Endocrine   Subclinical hypothyroidism   TSH today Some fatigue - thought due to osa       Relevant Orders   TSH (Completed)     Other   Vitamin D deficiency   D level today  Pt is taking D3 for bone and overall health      Relevant Orders   VITAMIN D 25 Hydroxy (Vit-D Deficiency, Fractures) (Completed)   Routine general medical examination at a health care facility - Primary   Reviewed health habits including diet and exercise and skin cancer prevention Reviewed appropriate screening tests for age  Also reviewed health mt list, fam hx and immunization status , as well as social and family history   See HPI Labs reviewed and ordered Health Maintenance  Topic Date Due   HIV Screening  Never done   Hepatitis C Screening  Never done   Colon Cancer Screening  08/11/2022   Zoster (Shingles) Vaccine (1 of 2) 05/15/2025*   Flu Shot  03/15/2024   DTaP/Tdap/Td vaccine (4 - Td or Tdap) 02/12/2034   COVID-19 Vaccine  Completed   Hepatitis B Vaccine  Aged Out   HPV Vaccine  Aged Out   Meningitis B Vaccine  Aged Out  *Topic was postponed. The date shown is not the original due  date.    Hep C and HIV screen today, low risk  Td updated (had recent cat bite)  Psa today  Referral done for recall colonoscopy  Discussed fall prevention, supplements and exercise for bone density    PHQ 3  Labs today  Prostate cancer screening   Psa ordered No urinary changes       Relevant Orders   PSA (Completed)   Mild headache   This may be due to elevated blood pressure   Pending lab Will likely start HTN medication       Hypersomnia with sleep apnea   Needs to follow up with pulmonary  Unable to keep mask on  Has day time somnolence , and loud snoring  Referred to discuss further  Discussed risks of untreated osa       Relevant Orders   Ambulatory referral to Pulmonology   Hyperlipidemia   Disc goals for lipids and reasons to control them Rev last labs with pt Rev low sat fat diet in detail   Lab today      Relevant Orders   Lipid Panel (Completed)   Comprehensive metabolic panel with GFR (Completed)   Fatigue   Suspect from OSA Ref to pulmonary to discuss treatment      Encounter for screening for HIV   HIV screen today  Low risk      Relevant Orders   HIV Antibody (routine testing w rflx)   Encounter for hepatitis C screening test for low risk patient   Hep C screen today      Relevant Orders   Hepatitis C Antibody   Elevated random blood glucose level   A1c ordered disc imp of low glycemic diet and wt loss to prevent DM2       Relevant Orders   Hemoglobin A1c (Completed)   Colon cancer screening   Referred for recall colonoscopy  Pt will schedule and needs to get a driver       Relevant Orders   Ambulatory referral to Gastroenterology   Other Visit Diagnoses       Need for Td vaccine       Relevant Orders   Td : Tetanus/diphtheria >7yo Preservative  free (Completed)

## 2024-02-13 NOTE — Assessment & Plan Note (Signed)
 Reviewed health habits including diet and exercise and skin cancer prevention Reviewed appropriate screening tests for age  Also reviewed health mt list, fam hx and immunization status , as well as social and family history   See HPI Labs reviewed and ordered Health Maintenance  Topic Date Due   HIV Screening  Never done   Hepatitis C Screening  Never done   Colon Cancer Screening  08/11/2022   Zoster (Shingles) Vaccine (1 of 2) 05/15/2025*   Flu Shot  03/15/2024   DTaP/Tdap/Td vaccine (4 - Td or Tdap) 02/12/2034   COVID-19 Vaccine  Completed   Hepatitis B Vaccine  Aged Out   HPV Vaccine  Aged Out   Meningitis B Vaccine  Aged Out  *Topic was postponed. The date shown is not the original due date.    Hep C and HIV screen today, low risk  Td updated (had recent cat bite)  Psa today  Referral done for recall colonoscopy  Discussed fall prevention, supplements and exercise for bone density    PHQ 3  Labs today

## 2024-02-13 NOTE — Assessment & Plan Note (Signed)
Hep C screen today 

## 2024-02-13 NOTE — Assessment & Plan Note (Signed)
 Blood pressure is elevated  No current medicines  Has family history Some mild headache- may be cause    BP Readings from Last 1 Encounters:  02/13/24 (!) 141/81   No changes needed Most recent labs reviewed  Disc lifstyle change with low sodium diet and exercise   Pending lab to start medication  Would avoid rate lowering medicines

## 2024-02-13 NOTE — Assessment & Plan Note (Signed)
 TSH today Some fatigue - thought due to osa

## 2024-02-13 NOTE — Assessment & Plan Note (Signed)
 Psa ordered No urinary changes

## 2024-02-13 NOTE — Assessment & Plan Note (Signed)
 This may be due to elevated blood pressure   Pending lab Will likely start HTN medication

## 2024-02-13 NOTE — Assessment & Plan Note (Signed)
 A1c ordered  disc imp of low glycemic diet and wt loss to prevent DM2

## 2024-02-13 NOTE — Assessment & Plan Note (Signed)
Disc goals for lipids and reasons to control them Rev last labs with pt Rev low sat fat diet in detail  Lab today

## 2024-02-13 NOTE — Assessment & Plan Note (Signed)
 Referred for recall colonoscopy  Pt will schedule and needs to get a driver

## 2024-02-13 NOTE — Assessment & Plan Note (Signed)
 D level today  Pt is taking D3 for bone and overall health

## 2024-02-13 NOTE — Assessment & Plan Note (Signed)
 Suspect from OSA Ref to pulmonary to discuss treatment

## 2024-02-13 NOTE — Assessment & Plan Note (Signed)
 HIV screen today Low risk

## 2024-02-14 LAB — HIV ANTIBODY (ROUTINE TESTING W REFLEX): HIV 1&2 Ab, 4th Generation: NONREACTIVE

## 2024-02-14 LAB — HEPATITIS C ANTIBODY: Hepatitis C Ab: NONREACTIVE

## 2024-03-11 ENCOUNTER — Encounter: Payer: Self-pay | Admitting: Family Medicine

## 2024-03-21 ENCOUNTER — Encounter: Payer: Self-pay | Admitting: Family Medicine

## 2024-03-24 MED ORDER — LOSARTAN POTASSIUM 50 MG PO TABS: 50.0000 mg | ORAL_TABLET | Freq: Two times a day (BID) | ORAL | 1 refills | Status: DC

## 2024-03-27 ENCOUNTER — Ambulatory Visit: Payer: Self-pay | Admitting: Family Medicine

## 2024-03-27 ENCOUNTER — Ambulatory Visit: Admitting: Family Medicine

## 2024-03-27 ENCOUNTER — Encounter: Payer: Self-pay | Admitting: Family Medicine

## 2024-03-27 VITALS — BP 136/80 | HR 50 | Temp 97.8°F | Ht 69.25 in | Wt 183.0 lb

## 2024-03-27 DIAGNOSIS — I6523 Occlusion and stenosis of bilateral carotid arteries: Secondary | ICD-10-CM

## 2024-03-27 DIAGNOSIS — R519 Headache, unspecified: Secondary | ICD-10-CM

## 2024-03-27 DIAGNOSIS — E7800 Pure hypercholesterolemia, unspecified: Secondary | ICD-10-CM

## 2024-03-27 DIAGNOSIS — I10 Essential (primary) hypertension: Secondary | ICD-10-CM

## 2024-03-27 LAB — BASIC METABOLIC PANEL WITH GFR
BUN: 18 mg/dL (ref 6–23)
CO2: 29 meq/L (ref 19–32)
Calcium: 9.2 mg/dL (ref 8.4–10.5)
Chloride: 102 meq/L (ref 96–112)
Creatinine, Ser: 1.02 mg/dL (ref 0.40–1.50)
GFR: 78.13 mL/min (ref >60.00)
Glucose, Bld: 104 mg/dL — ABNORMAL HIGH (ref 70–99)
Potassium: 4.2 meq/L (ref 3.5–5.1)
Sodium: 137 meq/L (ref 135–145)

## 2024-03-27 NOTE — Assessment & Plan Note (Signed)
 Midline forehead area/constant and sometimes improved when busy/active or distracted  No other neuro symptoms  Reassuring exam Did not improve with better blood pressure control   Given newness and age will get imagint CT head ordered   Call back and Er precautions noted in detail today

## 2024-03-27 NOTE — Progress Notes (Signed)
 Subjective:    Patient ID: Adam Ballard, male    DOB: November 10, 1960, 63 y.o.   MRN: [id number]  HPI  Wt Readings from Last 3 Encounters:  03/27/24 183 lb (83 kg)  02/13/24 183 lb 4 oz (83.1 kg)  01/26/22 184 lb 1 oz (83.5 kg)   26.83 kg/m  Vitals:   03/27/24 1050 03/27/24 1114  BP: (!) 144/80 136/80  Pulse: (!) 50   Temp: 97.8 F (36.6 C)   SpO2: 100%    Pt presents for follow up of HTN and headache    Last visit had elevated blood pressure  Was having some headaches  HTN runs in family   Is taking 50 mg losartan  bid  Running much better at home   Tolerates well    BP Readings from Last 3 Encounters:  03/27/24 136/80  02/13/24 (!) 141/81  01/26/22 132/70   Pulse Readings from Last 3 Encounters:  03/27/24 (!) 50  02/13/24 (!) 46  01/26/22 (!) 54   Still having some headaches  Traveling did not make it worse or better   No triggers he can establish  Pressure behind eyes-right in middle  Constant (not throbbing)  Improves sometimes when working hard   Staying very hydrated 1 cup of coffee in am   No other neuro symptoms   Watching diet    Lab Results  Component Value Date   NA 140 02/13/2024   K 4.7 02/13/2024   CO2 30 02/13/2024   GLUCOSE 104 (H) 02/13/2024   BUN 18 02/13/2024   CREATININE 1.04 02/13/2024   CALCIUM 9.3 02/13/2024   GFR 76.39 02/13/2024   GFRNONAA >90 05/26/2011   Lab Results  Component Value Date   TSH 3.79 02/13/2024   Lab Results  Component Value Date   ALT 23 02/13/2024   AST 20 02/13/2024   ALKPHOS 51 02/13/2024   BILITOT 0.8 02/13/2024   Lab Results  Component Value Date   WBC 6.4 02/13/2024   HGB 13.7 02/13/2024   HCT 41.2 02/13/2024   MCV 89.1 02/13/2024   PLT 265.0 02/13/2024   Lab Results  Component Value Date   HGBA1C 6.0 02/13/2024       Patient Active Problem List   Diagnosis Date Noted   Encounter for hepatitis C screening test for low risk patient 02/13/2024   Encounter for screening  for HIV 02/13/2024   Elevated random blood glucose level 02/13/2024   Vitamin D  deficiency 02/13/2024   Persistent headaches 02/13/2024   Fatigue 08/13/2021   Leg cramps 08/13/2021   Stress reaction 04/06/2021   Essential hypertension 11/18/2019   Subclinical hypothyroidism 02/03/2016   Prostate cancer screening 01/29/2016   Routine general medical examination at a health care facility 03/13/2013   Colon cancer screening 03/13/2013   Hypersomnia with sleep apnea 07/01/2008   Hyperlipidemia 06/27/2008   Allergic rhinitis 06/27/2008   Past Medical History:  Diagnosis Date   Allergic rhinitis    Allergy    Anxiety    Dementia (HCC)    History of kidney stones    recur 11/00   Hyperlipidemia    Low back pain    Poison ivy    frequent   Sleep apnea    uses cpap   Past Surgical History:  Procedure Laterality Date   ARTHROSCOPIC REPAIR ACL Right 1998   twice repaired   BACK SURGERY  06/01/2011   L3-L4 fusion   COLONOSCOPY  2014   CYSTOSCOPY WITH RETROGRADE PYELOGRAM, URETEROSCOPY  AND STENT PLACEMENT Right 06/21/2021   Procedure: CYSTOSCOPY WITH RETROGRADE PYELOGRAM, URETEROSCOPY AND STENT PLACEMENT;  Surgeon: Matilda Senior, MD;  Location: [facility];  Service: Urology;  Laterality: Right;   DRE 03/04, 08/06     HOLMIUM LASER APPLICATION Right 06/21/2021   Procedure: HOLMIUM LASER APPLICATION;  Surgeon: Matilda Senior, MD;  Location: [facility];  Service: Urology;  Laterality: Right;   LITHOTRIPSY     TRIGGER FINGER RELEASE Left 1998   Social History   Tobacco Use   Smoking status: Never   Smokeless tobacco: Never   Tobacco comments:    Pt tried ciggarettes several years ago, never habitually smoked.   Vaping Use   Vaping status: Never Used  Substance Use Topics   Alcohol use: Yes    Alcohol/week: 14.0 standard drinks of alcohol    Types: 14 Glasses of wine per week    Comment: 2-3 glasses daily per pt   Drug use: No   Family History  Problem Relation Age of  Onset   Hypertension Mother    Arrhythmia Father        A-Fib   Diabetes Father        DM2   Prostate cancer Father    Stroke Father    Cirrhosis Maternal Aunt        non-alcoholic   Leukemia Maternal Grandfather    Heart disease Paternal Grandfather        MI   Colon cancer Neg Hx    Esophageal cancer Neg Hx    Rectal cancer Neg Hx    Stomach cancer Neg Hx    Allergies  Allergen Reactions   Amlodipine     Current Outpatient Medications on File Prior to Visit  Medication Sig Dispense Refill   Doxylamine Succinate, Sleep, (UNISOM SLEEPTABS PO) Take 1 tablet by mouth at bedtime.     ibuprofen (ADVIL,MOTRIN) 200 MG tablet Take 400 mg by mouth every 6 (six) hours as needed for moderate pain.     losartan  (COZAAR ) 50 MG tablet Take 1 tablet (50 mg total) by mouth in the morning and at bedtime. 60 tablet 1   No current facility-administered medications on file prior to visit.    Review of Systems  Constitutional:  Negative for activity change, appetite change, fatigue, fever and unexpected weight change.  HENT:  Negative for congestion, rhinorrhea, sore throat and trouble swallowing.   Eyes:  Negative for pain, redness, itching and visual disturbance.  Respiratory:  Negative for cough, chest tightness, shortness of breath and wheezing.   Cardiovascular:  Negative for chest pain and palpitations.  Gastrointestinal:  Negative for abdominal pain, blood in stool, constipation, diarrhea and nausea.  Endocrine: Negative for cold intolerance, heat intolerance, polydipsia and polyuria.  Genitourinary:  Negative for difficulty urinating, dysuria, frequency and urgency.  Musculoskeletal:  Negative for arthralgias, joint swelling and myalgias.  Skin:  Negative for pallor and rash.  Neurological:  Positive for headaches. Negative for dizziness, tremors, seizures, syncope, facial asymmetry, speech difficulty, weakness, light-headedness and numbness.  Hematological:  Negative for adenopathy.  Does not bruise/bleed easily.  Psychiatric/Behavioral:  Negative for decreased concentration and dysphoric mood. The patient is not nervous/anxious.        Objective:   Physical Exam Constitutional:      General: He is not in acute distress.    Appearance: Normal appearance. He is well-developed and normal weight. He is not ill-appearing or diaphoretic.  HENT:     Head: Normocephalic and atraumatic.  Right Ear: External ear normal.     Left Ear: External ear normal.     Nose: Nose normal.     Mouth/Throat:     Pharynx: No oropharyngeal exudate.  Eyes:     General: No scleral icterus.       Right eye: No discharge.        Left eye: No discharge.     Conjunctiva/sclera: Conjunctivae normal.     Pupils: Pupils are equal, round, and reactive to light.     Comments: No nystagmus  Neck:     Thyroid : No thyromegaly.     Vascular: No carotid bruit or JVD.     Trachea: No tracheal deviation.  Cardiovascular:     Rate and Rhythm: Normal rate and regular rhythm.     Heart sounds: Normal heart sounds. No murmur heard. Pulmonary:     Effort: Pulmonary effort is normal. No respiratory distress.     Breath sounds: Normal breath sounds. No wheezing or rales.  Abdominal:     General: Bowel sounds are normal. There is no distension.     Palpations: Abdomen is soft. There is no mass.     Tenderness: There is no abdominal tenderness.  Musculoskeletal:        General: No tenderness.     Cervical back: Full passive range of motion without pain, normal range of motion and neck supple.  Lymphadenopathy:     Cervical: No cervical adenopathy.  Skin:    General: Skin is warm and dry.     Coloration: Skin is jaundiced. Skin is not pale.     Findings: No rash.  Neurological:     Mental Status: He is alert and oriented to person, place, and time.     Cranial Nerves: No cranial nerve deficit, dysarthria or facial asymmetry.     Sensory: No sensory deficit.     Motor: No weakness, tremor,  atrophy, abnormal muscle tone, seizure activity or pronator drift.     Coordination: Romberg sign negative. Coordination normal. Finger-Nose-Finger Test normal.     Gait: Gait normal.     Deep Tendon Reflexes: Reflexes are normal and symmetric.     Comments: No focal cerebellar signs   Psychiatric:        Mood and Affect: Mood normal.        Behavior: Behavior normal.        Thought Content: Thought content normal.           Assessment & Plan:   Problem List Items Addressed This Visit       Cardiovascular and Mediastinum   Essential hypertension - Primary   Improved blood pressure with losartan  50 mg bid  (works best to split dose) bp in fair control at this time  BP Readings from Last 1 Encounters:  03/27/24 136/80   No changes needed Most recent labs reviewed  Disc lifstyle change with low sodium diet and exercise   Continues to monitor at home/better there   Bmet today        Relevant Orders   Basic metabolic panel with GFR     Other   Persistent headaches   Midline forehead area/constant and sometimes improved when busy/active or distracted  No other neuro symptoms  Reassuring exam Did not improve with better blood pressure control   Given newness and age will get imagint CT head ordered   Call back and Er precautions noted in detail today        Relevant  Orders   CT HEAD WO CONTRAST ( )

## 2024-03-27 NOTE — Patient Instructions (Addendum)
 I put the referral in for CT of the head  Please let us  know if you don't hear in 1-2 weeks to set that up (mychart message or call or letter)   If symptoms worsen or change let us  know in the meantime    Continue the losartan  at 50 mg twice  Labs today for kidney labs

## 2024-03-27 NOTE — Assessment & Plan Note (Signed)
 Improved blood pressure with losartan  50 mg bid  (works best to split dose) bp in fair control at this time  BP Readings from Last 1 Encounters:  03/27/24 136/80   No changes needed Most recent labs reviewed  Disc lifstyle change with low sodium diet and exercise   Continues to monitor at home/better there   Bmet today

## 2024-03-28 ENCOUNTER — Encounter: Payer: Self-pay | Admitting: Gastroenterology

## 2024-03-28 ENCOUNTER — Ambulatory Visit
Admission: RE | Admit: 2024-03-28 | Discharge: 2024-03-28 | Disposition: A | Discharge status: Home / Self Care | Source: Ambulatory Visit | Attending: Family Medicine | Admitting: Family Medicine

## 2024-03-28 DIAGNOSIS — R519 Headache, unspecified: Secondary | ICD-10-CM

## 2024-04-04 ENCOUNTER — Encounter: Payer: Self-pay | Admitting: Family Medicine

## 2024-04-04 DIAGNOSIS — I6523 Occlusion and stenosis of bilateral carotid arteries: Secondary | ICD-10-CM | POA: Insufficient documentation

## 2024-04-07 MED ORDER — ROSUVASTATIN CALCIUM 10 MG PO TABS: 10.0000 mg | ORAL_TABLET | Freq: Every day | ORAL | 3 refills | Status: DC

## 2024-04-08 ENCOUNTER — Telehealth: Payer: Self-pay | Admitting: *Deleted

## 2024-04-08 NOTE — Telephone Encounter (Signed)
 Pt is aware via mychart. PCP started him on new cholesterol meds. Per PCP pt needs lipid panel in 6 weeks fasting.  Please schedule fasting lab appt thanks

## 2024-04-09 ENCOUNTER — Ambulatory Visit (HOSPITAL_COMMUNITY)
Admission: RE | Admit: 2024-04-09 | Discharge: 2024-04-09 | Disposition: A | Discharge status: Home / Self Care | Source: Ambulatory Visit | Attending: Family Medicine | Admitting: Family Medicine

## 2024-04-09 ENCOUNTER — Ambulatory Visit: Payer: Self-pay | Admitting: Family Medicine

## 2024-04-09 DIAGNOSIS — I6523 Occlusion and stenosis of bilateral carotid arteries: Secondary | ICD-10-CM | POA: Diagnosis not present

## 2024-04-18 ENCOUNTER — Other Ambulatory Visit: Payer: Self-pay | Admitting: Family Medicine

## 2024-04-22 ENCOUNTER — Other Ambulatory Visit: Payer: Self-pay | Admitting: *Deleted

## 2024-04-22 MED ORDER — LOSARTAN POTASSIUM 50 MG PO TABS: 50.0000 mg | ORAL_TABLET | Freq: Two times a day (BID) | ORAL | 1 refills | Status: AC

## 2024-04-25 ENCOUNTER — Ambulatory Visit (INDEPENDENT_AMBULATORY_CARE_PROVIDER_SITE_OTHER): Admitting: Internal Medicine

## 2024-04-25 ENCOUNTER — Encounter: Payer: Self-pay | Admitting: Internal Medicine

## 2024-04-25 VITALS — BP 120/80 | HR 50 | Temp 98.0°F | Ht 70.0 in | Wt 185.8 lb

## 2024-04-25 DIAGNOSIS — G4733 Obstructive sleep apnea (adult) (pediatric): Secondary | ICD-10-CM

## 2024-04-25 NOTE — Patient Instructions (Signed)
 Recommend referral to Dr. Jude to assess for inspire device

## 2024-04-25 NOTE — Progress Notes (Signed)
 Name: Adam Ballard MRN: [id number] DOB: 04-Feb-1961    CHIEF COMPLAINT:  ASSESSMENT OF SLEEP APNEA   HISTORY OF PRESENT ILLNESS: Patient is seen today for problems and issues with sleep related to excessive daytime sleepiness Patient  has been having sleep problems for many years Patient has been having excessive daytime sleepiness for a long time Patient has been having extreme fatigue and tiredness, lack of energy +  very Loud snoring every night + struggling breathe at night and gasps for air  Diagnosed with severe sleep apnea many years ago AHI of 30  Patient is intolerant of CPAP and mask Patient is here to discuss therapy with inspire device  Discussed sleep data and reviewed with patient.  Encouraged proper weight management.  Discussed driving precautions and its relationship with hypersomnolence.  Discussed operating dangerous equipment and its relationship with hypersomnolence.  Discussed sleep hygiene, and benefits of a fixed sleep waked time.  The importance of getting eight or more hours of sleep discussed with patient.  Discussed limiting the use of the computer and television before bedtime.  Decrease naps during the day, so night time sleep will become enhanced.  Limit caffeine, and sleep deprivation.  HTN, stroke, and heart failure are potential risk factors for untreated sleep apnea      04/25/2024    8:00 AM  Results of the Epworth flowsheet  Sitting and reading 3  Watching TV 2  Sitting, inactive in a public place (e.g. a theatre or a meeting) 0  As a passenger in a car for an hour without a break 0  Lying down to rest in the afternoon when circumstances permit 1  Sitting and talking to someone 1  Sitting quietly after a lunch without alcohol 2  In a car, while stopped for a few minutes in traffic 1  Total score 10    No exacerbation at this time No evidence of heart failure at this time No evidence or signs of infection at this time No  respiratory distress No fevers, chills, nausea, vomiting, diarrhea No evidence of lower extremity edema No evidence hemoptysis     PAST MEDICAL HISTORY :   has a past medical history of Allergic rhinitis, Allergy, Anxiety, Dementia (HCC), History of kidney stones, Hyperlipidemia, Low back pain, Poison ivy, and Sleep apnea.  has a past surgical history that includes DRE 03/04, 08/06; Back surgery (06/01/2011); Arthroscopic repair ACL (Right, 1998); Lithotripsy; Trigger finger release (Left, 1998); Colonoscopy (2014); Cystoscopy with retrograde pyelogram, ureteroscopy and stent placement (Right, 06/21/2021); and Holmium laser application (Right, 06/21/2021). Prior to Admission medications   Medication Sig Start Date End Date Taking? Authorizing Provider  Doxylamine Succinate, Sleep, (UNISOM SLEEPTABS PO) Take 1 tablet by mouth at bedtime.    [provider]  ibuprofen (ADVIL,MOTRIN) 200 MG tablet Take 400 mg by mouth every 6 (six) hours as needed for moderate pain.    [provider]  losartan  (COZAAR ) 50 MG tablet Take 1 tablet (50 mg total) by mouth in the morning and at bedtime. 04/22/24   Tower, Laine LABOR, MD  rosuvastatin  (CRESTOR ) 10 MG tablet Take 1 tablet (10 mg total) by mouth daily. In evening 04/07/24   Tower, Laine LABOR, MD   Allergies  Allergen Reactions   Amlodipine      FAMILY HISTORY:  family history includes Arrhythmia in his father; Cirrhosis in his maternal aunt; Diabetes in his father; Heart disease in his paternal grandfather; Hypertension in his mother; Leukemia in his maternal grandfather;  Prostate cancer in his father; Stroke in his father. SOCIAL HISTORY:  reports that he has never smoked. He has never used smokeless tobacco. He reports current alcohol use of about 14.0 standard drinks of alcohol per week. He reports that he does not use drugs.    BP 120/80   Pulse (!) 50   Temp 98 F (36.7 C)   Ht 5' 10 (1.778 m)   Wt 185 lb 12.8 oz (84.3 kg)   SpO2  97%   BMI 26.66 kg/m       Review of Systems: Gen:  Denies  fever, sweats, chills weight loss  HEENT: Denies blurred vision, double vision, ear pain, eye pain, hearing loss, nose bleeds, sore throat Cardiac:  No dizziness, chest pain or heaviness, chest tightness,edema, No JVD Resp:   No cough, -sputum production, -shortness of breath,-wheezing, -hemoptysis,  Other:  All other systems negative   Physical Examination:   General Appearance: No distress  EYES PERRLA, EOM intact.   NECK Supple, No JVD Pulmonary: normal breath sounds, No wheezing.  CardiovascularNormal S1,S2.  No m/r/g.   Abdomen: Benign, Soft, non-tender. Neurology UE/LE 5/5 strength, no focal deficits Ext pulses intact, cap refill intact ALL OTHER ROS ARE NEGATIVE     ASSESSMENT AND PLAN SYNOPSIS  Patient with signs and symptoms of excessive daytime sleepiness with underlying diagnosis of severe obstructive sleep apnea    I have discussed all options with patient at this time  Option #1 Try autoCPAP therapy 4-14 cm h20, with face mask of choice  Option #2  Assess for hypoglossal nerve stimulator after trying CPAP for 30 days  Option #3 Referral to dental office for oral appliance and repeat sleep study test   Patient has requested to be referred back to Dr. Jude to assess for inspire device    MEDICATION ADJUSTMENTS/LABS AND TESTS ORDERED: Recommend referral to Dr. Jude to assess for inspire device   CURRENT MEDICATIONS REVIEWED AT LENGTH WITH PATIENT TODAY   Patient  satisfied with Plan of action and management. All questions answered   Follow up as needed   I spent a total of 61 minutes dedicated to the care of this patient on the date of this encounter to include pre-visit review of records, face-to-face time with the patient discussing conditions above, post visit ordering of testing, clinical documentation with the electronic health record, making appropriate referrals as  documented, and communicating necessary information to the patient's healthcare team.     Nickolas Alm Cellar, M.D.  [facility]  Medical Director [facility] Medical Director [facility] Cardio-Pulmonary Department

## 2024-04-30 ENCOUNTER — Ambulatory Visit (AMBULATORY_SURGERY_CENTER)

## 2024-04-30 ENCOUNTER — Other Ambulatory Visit: Payer: Self-pay | Admitting: Family Medicine

## 2024-04-30 ENCOUNTER — Encounter: Payer: Self-pay | Admitting: Family Medicine

## 2024-04-30 VITALS — Ht 70.0 in | Wt 183.0 lb

## 2024-04-30 DIAGNOSIS — Z860100 Personal history of colon polyps, unspecified: Secondary | ICD-10-CM

## 2024-04-30 MED ORDER — NA SULFATE-K SULFATE-MG SULF 17.5-3.13-1.6 GM/177ML PO SOLN: 1.0000 | Freq: Once | ORAL | 0 refills | Status: AC

## 2024-04-30 NOTE — Progress Notes (Signed)

## 2024-05-02 ENCOUNTER — Telehealth: Payer: Self-pay

## 2024-05-02 NOTE — Progress Notes (Signed)
 Care Guide Pharmacy Note  05/02/2024 Name: Adam Ballard MRN: [id number] DOB: [birth year]  Referred By: Randeen Laine LABOR, MD Reason for referral: Complex Care Management and Call Attempt #1 (Successful initial outreach scheduled with PHARM D- Manuelita)   Adam Ballard is a 63 y.o. year old male who is a primary care patient of Tower, Laine LABOR, MD.  Adam Ballard was referred to the pharmacist for assistance related to: DMII  Successful contact was made with the patient to discuss pharmacy services including being ready for the pharmacist to call at least 5 minutes before the scheduled appointment time and to have medication bottles and any blood pressure readings ready for review. The patient agreed to meet with the pharmacist via telephone visit on (date/time). 05/09/24 @ 1 PM.    Leotis Rase [facility] Guide  Direct Dial: [phone]  Fax [phone]

## 2024-05-09 ENCOUNTER — Other Ambulatory Visit (INDEPENDENT_AMBULATORY_CARE_PROVIDER_SITE_OTHER)

## 2024-05-09 DIAGNOSIS — E7800 Pure hypercholesterolemia, unspecified: Secondary | ICD-10-CM

## 2024-05-09 NOTE — Progress Notes (Signed)
 05/09/2024 Name: Adam Ballard MRN: [id number] DOB: 1961/04/29  Subjective  Chief Complaint  Patient presents with   Hyperlipidemia    Reason for visit: ?  Adam Ballard is a 63 y.o. male who presents today for an initial pharmacotherapy visit.? They were referred by their PCP for assistance in management of HLD. Pertinent PMH includes HTN, sleep apnea.  Care Team: Primary Care Provider: Tower, Laine LABOR, MD  Medication Access/Adherence: Prescription drug coverage: Payor: BLUE CROSS BLUE SHIELD / Plan: BCBS/FEDER[employer] E[employer] PPO / Product Type: *No Product type* / .  Reports that all medications are  affordable.  Current Patient Assistance: None Medication Adherence: Patient denies missing doses of their medication.    Reported Lipid Regimen: ?  N/A   Lipid-lowering medications tried in the past:?  Rosuvastatin  10 mg (myalgia - full body aches, also w swelling - resolved with discontinuation)  Reported Diet: Previous diet: Read meats, including all the fats. Large portions (1 lb of meat at a time). Bacon, sausage, etc.   Notes he is motivated to reduce his cholesterol intake through his diet. Working on portion [employer].   Exercise: very active   Cardiovascular Risk Reduction History of clinical ASCVD? no The 10-year ASCVD risk score (Arnett DK, et al., 2019) is: 9.8% PREVENT: 10-Yr Risk CVD 9.1% Coronary Calcium  Score: N/A History of heart failure? no History of diabetes: Pre-diabetes, A1c 6.0% Current BMI: 26.3 kg/m2 (Ht 70 in, Wt 83 kg) Taking statin? intolerant; myalgia (rosuvastatin  10) Taking aspirin? not indicated   Taking SGLT-2i? no Taking GLP- 1 RA? no     _______________________________________________  Objective    Vitals:  Wt Readings from Last 3 Encounters:  04/30/24 183 lb (83 kg)  04/25/24 185 lb 12.8 oz (84.3 kg)  03/27/24 183 lb (83 kg)   BP Readings from Last 3 Encounters:  04/25/24 120/80  03/27/24 136/80  02/13/24 (!) 141/81    Pulse Readings from Last 3 Encounters:  04/25/24 (!) 50  03/27/24 (!) 50  02/13/24 (!) 46     Labs:?   Lab Results  Component Value Date   CHOL 245 (H) 02/13/2024   LDLCALC 153 (H) 02/13/2024   LDLCALC 154 (H) 09/17/2021   LDLCALC 147 (H) 04/06/2020   LDLDIRECT 138.8 12/30/2008   LDLDIRECT 142.6 06/27/2008   HDL 81.60 02/13/2024   TRIG 56.0 02/13/2024   TRIG 69 09/17/2021   TRIG 60.0 04/06/2020   ALT 23 02/13/2024   ALT 24 08/13/2021   AST 20 02/13/2024   AST 20 08/13/2021   Lab Results  Component Value Date   AST 20 02/13/2024   AST 20 08/13/2021   ALT 23 02/13/2024   ALT 24 08/13/2021   BILITOT 0.8 02/13/2024   BILITOT 0.5 08/13/2021   ALBUMIN 4.6 02/13/2024   ALBUMIN 4.7 04/06/2020   INR 0.92 05/26/2011   ALKPHOS 51 02/13/2024   ALKPHOS 48 04/06/2020   Lab Results  Component Value Date   HGBA1C 6.0 02/13/2024   GLUCOSE 104 (H) 03/27/2024   CREATININE 1.02 03/27/2024   CREATININE 1.04 02/13/2024   CREATININE 1.35 08/13/2021   GFR 78.13 03/27/2024   GFR 76.39 02/13/2024   GFR 72.92 04/06/2020     Chemistry      Component Value Date/Time   NA 137 03/27/2024 1124   K 4.2 03/27/2024 1124   CL 102 03/27/2024 1124   CO2 29 03/27/2024 1124   BUN 18 03/27/2024 1124   CREATININE 1.02 03/27/2024 1124  CREATININE 1.35 08/13/2021 1557      Component Value Date/Time   CALCIUM  9.2 03/27/2024 1124   ALKPHOS 51 02/13/2024 1039   AST 20 02/13/2024 1039   ALT 23 02/13/2024 1039   BILITOT 0.8 02/13/2024 1039      The 10-year ASCVD risk score (Arnett DK, et al., 2019) is: 9.8%  Assessment and Plan:    Hyperlipidemia (primary prevention): uncontrolled on last lipid panel (02/13/24): TC 245 mg/dL, LDL 846 mg/dL, TG 56 mg/dL. LDL goal <70 reasonable per primary prevention with elevated ASCVD risk score. Repeat lipid panel scheduled ~1.5 weeks d/t non-fasting result prior. Reviewed LDL-reduction propensity of ezetimibe vs PCSK9i therapy. Do not expect ezetimibe  alone to sufficiently reduce LDL. Patient reports great motivation to significantly change diet to support cholesterol goals.  - Previous therapies: rosuvastatin  10 mg daily (myalgia/swelling) - Key risk factors include: over weight, pre-diabetes-range A1c, hypertension (well-controlled) and hyperlipidemia, Father with history of multiple strokes. - The 10-year ASCVD risk score (Arnett DK, et al., 2019) is: 9.8% indicating elevated risk Start ezetimibe 10 mg daily Motivated to significantly change diet Maintain current level of physical activity as able  Repeat lipid panel 4-12 weeks       Future Consideration: Ezetimibe: IMPROVE-IT Reasonable to consider per benefits demonstrated in the IMPROVE-IT trial (though this is in addition to statin). Notably, ezetimibe unlikely to reduce LDL to a level <70 mg/dL given expected ~74% reduction. Will likely require more intensive therapy.  PCSK9i: Ideal per greater LDL-lowering propensity ~50-60%.  Prior auth initiated in [facility]: [place] = [id number]. Patient preference to start with dietary change + ezetimibe. Agreeable to PCSK9i if LDL >70 mg/dL on follow up lipid panel.  [facility]): Reasonable, though likely more challenging to get covered.    Follow Up Patient given direct line for questions regarding medication therapy   Future Appointments  Date Time Provider Department Center  05/15/2024  8:30 AM Mansouraty, Aloha Raddle., MD [facility]  05/21/2024  7:45 AM [facility] [address]    Manuelita FABIENE Kobs, PharmD Clinical Pharmacist [facility] [phone]

## 2024-05-15 ENCOUNTER — Encounter: Payer: Self-pay | Admitting: Gastroenterology

## 2024-05-15 ENCOUNTER — Ambulatory Visit: Admitting: Gastroenterology

## 2024-05-15 VITALS — BP 105/65 | HR 53 | Temp 97.2°F | Resp 14 | Ht 69.25 in | Wt 188.0 lb

## 2024-05-15 DIAGNOSIS — K641 Second degree hemorrhoids: Secondary | ICD-10-CM

## 2024-05-15 DIAGNOSIS — K644 Residual hemorrhoidal skin tags: Secondary | ICD-10-CM

## 2024-05-15 DIAGNOSIS — Z1211 Encounter for screening for malignant neoplasm of colon: Secondary | ICD-10-CM

## 2024-05-15 DIAGNOSIS — D128 Benign neoplasm of rectum: Secondary | ICD-10-CM | POA: Diagnosis not present

## 2024-05-15 DIAGNOSIS — Z860101 Personal history of adenomatous and serrated colon polyps: Secondary | ICD-10-CM

## 2024-05-15 DIAGNOSIS — Z860100 Personal history of colon polyps, unspecified: Secondary | ICD-10-CM

## 2024-05-15 DIAGNOSIS — K5730 Diverticulosis of large intestine without perforation or abscess without bleeding: Secondary | ICD-10-CM

## 2024-05-15 MED ORDER — SODIUM CHLORIDE 0.9 % IV SOLN: 500.0000 mL | Freq: Once | INTRAVENOUS | Status: DC

## 2024-05-15 NOTE — Patient Instructions (Signed)
 High fiber diet. Use FiberCon 1-2 tablets PO daily. Continue present medications. Await pathology results. Repeat colonoscopy in 5-7 years for surveillance.  Please read over handouts provided  YOU HAD AN ENDOSCOPIC PROCEDURE TODAY AT THE [facility]:   Refer to the procedure report that was given to you for any specific questions about what was found during the examination.  If the procedure report does not answer your questions, please call your gastroenterologist to clarify.  If you requested that your care partner not be given the details of your procedure findings, then the procedure report has been included in a sealed envelope for you to review at your convenience later.  YOU SHOULD EXPECT: Some feelings of bloating in the abdomen. Passage of more gas than usual.  Walking can help get rid of the air that was put into your GI tract during the procedure and reduce the bloating. If you had a lower endoscopy (such as a colonoscopy or flexible sigmoidoscopy) you may notice spotting of blood in your stool or on the toilet paper. If you underwent a bowel prep for your procedure, you may not have a normal bowel movement for a few days.  Please Note:  You might notice some irritation and congestion in your nose or some drainage.  This is from the oxygen used during your procedure.  There is no need for concern and it should clear up in a day or so.  SYMPTOMS TO REPORT IMMEDIATELY:  Following lower endoscopy (colonoscopy or flexible sigmoidoscopy):  Excessive amounts of blood in the stool  Significant tenderness or worsening of abdominal pains  Swelling of the abdomen that is new, acute  Fever of 100F or higher  For urgent or emergent issues, a gastroenterologist can be reached at any hour by calling [phone]. Do not use MyChart messaging for urgent concerns.    DIET:  We do recommend a small meal at first, but then you may proceed to your regular diet.  Drink plenty of  fluids but you should avoid alcoholic beverages for 24 hours.  ACTIVITY:  You should plan to take it easy for the rest of today and you should NOT DRIVE or use heavy machinery until tomorrow (because of the sedation medicines used during the test).    FOLLOW UP: Our staff will call the number listed on your records the next business day following your procedure.  We will call around 7:15- 8:00 am to check on you and address any questions or concerns that you may have regarding the information given to you following your procedure. If we do not reach you, we will leave a message.     If any biopsies were taken you will be contacted by phone or by letter within the next 1-3 weeks.  Please call us  at [phone] if you have not heard about the biopsies in 3 weeks.    SIGNATURES/CONFIDENTIALITY: You and/or your care partner have signed paperwork which will be entered into your electronic medical record.  These signatures attest to the fact that that the information above on your After Visit Summary has been reviewed and is understood.  Full responsibility of the confidentiality of this discharge information lies with you and/or your care-partner.

## 2024-05-15 NOTE — Progress Notes (Signed)
 Called to room to assist during endoscopic procedure.  Patient ID and intended procedure confirmed with present staff. Received instructions for my participation in the procedure from the performing physician.

## 2024-05-15 NOTE — Progress Notes (Signed)
 Pt denies need to pass air while in the RR.  Abdomen soft, easily palpable.  Denies pain  Encouraged to ambulate and drink warm fluids at home if develops any gas pain.

## 2024-05-15 NOTE — Progress Notes (Signed)
 GASTROENTEROLOGY PROCEDURE H&P NOTE   Primary Care Physician: Tower, Laine LABOR, MD  HPI: Adam Ballard is a 63 y.o. male who presents for Colonoscopy for surveillance of previous adenomas and normal in 2018.  Past Medical History:  Diagnosis Date   Allergic rhinitis    Allergy Birth   Anxiety    When my father died   Dementia (HCC)    History of kidney stones    recur 11/00   Hyperlipidemia    Hypertension    Low back pain    Poison ivy    frequent   Sleep apnea    uses cpap   Past Surgical History:  Procedure Laterality Date   ARTHROSCOPIC REPAIR ACL Right 1998   twice repaired   BACK SURGERY  06/01/2011   L3-L4 fusion   COLONOSCOPY  2014   CYSTOSCOPY WITH RETROGRADE PYELOGRAM, URETEROSCOPY AND STENT PLACEMENT Right 06/21/2021   Procedure: CYSTOSCOPY WITH RETROGRADE PYELOGRAM, URETEROSCOPY AND STENT PLACEMENT;  Surgeon: Matilda Senior, MD;  Location: [facility];  Service: Urology;  Laterality: Right;   DRE 03/04, 08/06     HOLMIUM LASER APPLICATION Right 06/21/2021   Procedure: HOLMIUM LASER APPLICATION;  Surgeon: Matilda Senior, MD;  Location: [facility];  Service: Urology;  Laterality: Right;   LITHOTRIPSY     SPINE SURGERY     TRIGGER FINGER RELEASE Left 1998   Current Outpatient Medications  Medication Sig Dispense Refill   Doxylamine Succinate, Sleep, (UNISOM SLEEPTABS PO) Take 1 tablet by mouth at bedtime.     losartan  (COZAAR ) 50 MG tablet Take 1 tablet (50 mg total) by mouth in the morning and at bedtime. 180 tablet 1   ibuprofen (ADVIL,MOTRIN) 200 MG tablet Take 400 mg by mouth every 6 (six) hours as needed for moderate pain.     No current facility-administered medications for this visit.    Current Outpatient Medications:    Doxylamine Succinate, Sleep, (UNISOM SLEEPTABS PO), Take 1 tablet by mouth at bedtime., Disp: , Rfl:    losartan  (COZAAR ) 50 MG tablet, Take 1 tablet (50 mg total) by mouth in the morning and at bedtime., Disp: 180 tablet, Rfl:  1   ibuprofen (ADVIL,MOTRIN) 200 MG tablet, Take 400 mg by mouth every 6 (six) hours as needed for moderate pain., Disp: , Rfl:  Allergies  Allergen Reactions   Amlodipine  Other (See Comments)    Pt unsure of allergy   Crestor  [Rosuvastatin ] Other (See Comments)    Myalgias    Family History  Problem Relation Age of Onset   Hypertension Mother    Arrhythmia Father        A-Fib   Diabetes Father        DM2   Prostate cancer Father    Stroke Father    Heart disease Father    Cirrhosis Maternal Aunt        non-alcoholic   Leukemia Maternal Grandfather    Heart disease Paternal Grandfather        MI   Colon cancer Neg Hx    Esophageal cancer Neg Hx    Rectal cancer Neg Hx    Stomach cancer Neg Hx    Colon polyps Neg Hx    Social History   Socioeconomic History   Marital status: Married    Spouse name: Not on file   Number of children: Not on file   Years of education: Not on file   Highest education level: Bachelor's degree (e.g., BA, AB, BS)  Occupational  History   Occupation: retired    Comment: Merchandiser, retail.   Tobacco Use   Smoking status: Never   Smokeless tobacco: Never   Tobacco comments:    Pt tried ciggarettes several years ago, never habitually smoked.   Vaping Use   Vaping status: Never Used  Substance and Sexual Activity   Alcohol use: Yes    Alcohol/week: 14.0 standard drinks of alcohol    Types: 14 Glasses of wine per week    Comment: 2-3 glasses daily per pt   Drug use: No   Sexual activity: Not on file  Other Topics Concern   Not on file  Social History Narrative   Not on file   Social Drivers of Health   Financial Resource Strain: Low Risk  (02/13/2024)   Overall Financial Resource Strain (CARDIA)    Difficulty of Paying Living Expenses: Not hard at all  Food Insecurity: No Food Insecurity (02/13/2024)   Hunger Vital Sign    Worried About Running Out of Food in the Last Year: Never true    Ran Out of Food in the Last Year: Never  true  Transportation Needs: Unknown (02/13/2024)   PRAPARE - Administrator, Civil Service (Medical): Not on file    Lack of Transportation (Non-Medical): No  Physical Activity: Sufficiently Active (02/13/2024)   Exercise Vital Sign    Days of Exercise per Week: 5 days    Minutes of Exercise per Session: 120 min  Stress: Stress Concern Present (02/13/2024)   [employer] of Occupational Health - Occupational Stress Questionnaire    Feeling of Stress: To some extent  Social Connections: Moderately Integrated (02/13/2024)   Social Connection and Isolation Panel    Frequency of Communication with Friends and Family: Three times a week    Frequency of Social Gatherings with Friends and Family: Twice a week    Attends Religious Services: Never    Database administrator or Organizations: Yes    Attends Engineer, structural: More than 4 times per year    Marital Status: Married  Catering manager Violence: Not on file    Physical Exam: Today's Vitals   05/15/24 0840  BP: 110/69  Pulse: (!) 54  Temp: (!) 97.2 F (36.2 C)  TempSrc: Temporal  SpO2: 100%  Weight: 188 lb (85.3 kg)  Height: 5' 9.25 (1.759 m)   Body mass index is 27.56 kg/m. GEN: NAD EYE: Sclerae anicteric ENT: MMM CV: Non-tachycardic GI: Soft, NT/ND NEURO:  Alert & Oriented x 3  Lab Results: No results for input(s): WBC, HGB, HCT, PLT in the last 72 hours. BMET No results for input(s): NA, K, CL, CO2, GLUCOSE, BUN, CREATININE, CALCIUM  in the last 72 hours. LFT No results for input(s): PROT, ALBUMIN, AST, ALT, ALKPHOS, BILITOT, BILIDIR, IBILI in the last 72 hours. PT/INR No results for input(s): LABPROT, INR in the last 72 hours.   Impression / Plan: This is a 63 y.o.male who presents for Colonoscopy for surveillance of previous adenomas and normal in 2018.  The risks and benefits of endoscopic evaluation/treatment were discussed with the patient  and/or family; these include but are not limited to the risk of perforation, infection, bleeding, missed lesions, lack of diagnosis, severe illness requiring hospitalization, as well as anesthesia and sedation related illnesses.  The patient's history has been reviewed, patient examined, no change in status, and deemed stable for procedure.  The patient and/or family is agreeable to proceed.    [employer],  MD [facility] Office # [phone]

## 2024-05-15 NOTE — Op Note (Signed)
 [facility] Patient Name: Adam Ballard Procedure Date: 05/15/2024 9:32 AM MRN: [id number] Endoscopist: Aloha Finner , MD, [id number] Age: 63 Referring MD:  Date of Birth: 10-Jul-1961 Gender: Male Account #: [id number] Procedure:                Colonoscopy Indications:              High risk colon cancer surveillance: Personal                            history of colonic polyps, High risk colon cancer                            surveillance: Personal history of adenoma less than                            10 mm in size Medicines:                Monitored Anesthesia Care Procedure:                Pre-Anesthesia Assessment:                           - Prior to the procedure, a History and Physical                            was performed, and patient medications and                            allergies were reviewed. The patient's tolerance of                            previous anesthesia was also reviewed. The risks                            and benefits of the procedure and the sedation                            options and risks were discussed with the patient.                            All questions were answered, and informed consent                            was obtained. Prior Anticoagulants: The patient has                            taken no anticoagulant or antiplatelet agents. ASA                            Grade Assessment: II - A patient with mild systemic                            disease. After reviewing the risks and benefits,  the patient was deemed in satisfactory condition to                            undergo the procedure.                           After obtaining informed consent, the colonoscope                            was passed under direct vision. Throughout the                            procedure, the patient's blood pressure, pulse, and                            oxygen saturations were monitored  continuously. The                            Olympus CF-HQ190L ([id number]) Colonoscope was                            introduced through the anus and advanced to the the                            cecum, identified by appendiceal orifice and                            ileocecal valve. The colonoscopy was performed                            without difficulty. The patient tolerated the                            procedure. The quality of the bowel preparation was                            adequate. The ileocecal valve, appendiceal orifice,                            and rectum were photographed. Scope In: 9:42:50 AM Scope Out: 9:54:07 AM Scope Withdrawal Time: 0 hours 8 minutes 19 seconds  Total Procedure Duration: 0 hours 11 minutes 17 seconds  Findings:                 The digital rectal exam findings include                            hemorrhoids. Pertinent negatives include no                            palpable rectal lesions.                           A 4 mm polyp was found in the rectum. The polyp was  sessile. The polyp was removed with a cold snare.                            Resection and retrieval were complete.                           Multiple small-mouthed diverticula were found in                            the recto-sigmoid colon and sigmoid colon.                           Normal mucosa was found in the entire colon                            otherwise.                           Non-bleeding non-thrombosed internal hemorrhoids                            were found during retroflexion, during perianal                            exam and during digital exam. The hemorrhoids were                            Grade II (internal hemorrhoids that prolapse but                            reduce spontaneously). Complications:            No immediate complications. Estimated Blood Loss:     Estimated blood loss was minimal. Impression:               -  Hemorrhoids found on digital rectal exam.                           - One 4 mm polyp in the rectum, removed with a cold                            snare. Resected and retrieved.                           - Diverticulosis in the recto-sigmoid colon and in                            the sigmoid colon.                           - Normal mucosa in the entire examined colon                            otherwise.                           -  Non-bleeding non-thrombosed internal hemorrhoids. Recommendation:           - The patient will be observed post-procedure,                            until all discharge criteria are met.                           - Discharge patient to home.                           - Patient has a contact number available for                            emergencies. The signs and symptoms of potential                            delayed complications were discussed with the                            patient. Return to normal activities tomorrow.                            Written discharge instructions were provided to the                            patient.                           - High fiber diet.                           - Use FiberCon 1-2 tablets PO daily.                           - Continue present medications.                           - Await pathology results.                           - Repeat colonoscopy in 5-7 years for surveillance.                           - The findings and recommendations were discussed                            with the patient.                           - The findings and recommendations were discussed                            with the patient's family. Aloha Finner, MD 05/15/2024 9:57:24 AM

## 2024-05-15 NOTE — Progress Notes (Signed)
 Report to PACU, RN, vss, BBS= Clear.

## 2024-05-15 NOTE — Progress Notes (Signed)
 Pt's states no medical or surgical changes since previsit or office visit.

## 2024-05-16 ENCOUNTER — Telehealth: Payer: Self-pay

## 2024-05-16 NOTE — Telephone Encounter (Signed)
 No answer on follow up call - voice mail message left

## 2024-05-17 DIAGNOSIS — H40013 Open angle with borderline findings, low risk, bilateral: Secondary | ICD-10-CM | POA: Diagnosis not present

## 2024-05-17 LAB — SURGICAL PATHOLOGY

## 2024-05-18 ENCOUNTER — Ambulatory Visit: Payer: Self-pay | Admitting: Gastroenterology

## 2024-05-21 ENCOUNTER — Other Ambulatory Visit (INDEPENDENT_AMBULATORY_CARE_PROVIDER_SITE_OTHER)

## 2024-05-21 DIAGNOSIS — E7800 Pure hypercholesterolemia, unspecified: Secondary | ICD-10-CM

## 2024-05-21 LAB — ALT: ALT: 33 U/L (ref 0–53)

## 2024-05-21 LAB — LIPID PANEL
Cholesterol: 202 mg/dL — ABNORMAL HIGH (ref 0–200)
HDL: 53.5 mg/dL (ref >39.00)
LDL Cholesterol: 132 mg/dL — ABNORMAL HIGH (ref 0–99)
NonHDL: 148.2
Total CHOL/HDL Ratio: 4
Triglycerides: 80 mg/dL (ref 0.0–149.0)
VLDL: 16 mg/dL (ref 0.0–40.0)

## 2024-05-21 LAB — AST: AST: 23 U/L (ref 0–37)

## 2024-05-26 MED ORDER — EZETIMIBE 10 MG PO TABS: 10.0000 mg | ORAL_TABLET | Freq: Every day | ORAL | 3 refills | Status: AC

## 2024-06-24 ENCOUNTER — Ambulatory Visit (HOSPITAL_BASED_OUTPATIENT_CLINIC_OR_DEPARTMENT_OTHER): Admitting: Pulmonary Disease

## 2024-07-04 ENCOUNTER — Ambulatory Visit (INDEPENDENT_AMBULATORY_CARE_PROVIDER_SITE_OTHER): Admitting: Pulmonary Disease

## 2024-07-04 ENCOUNTER — Encounter (HOSPITAL_BASED_OUTPATIENT_CLINIC_OR_DEPARTMENT_OTHER): Payer: Self-pay | Admitting: Pulmonary Disease

## 2024-07-04 ENCOUNTER — Telehealth: Payer: Self-pay | Admitting: Pulmonary Disease

## 2024-07-04 VITALS — BP 139/89 | HR 63 | Ht 69.5 in | Wt 183.6 lb

## 2024-07-04 DIAGNOSIS — G4733 Obstructive sleep apnea (adult) (pediatric): Secondary | ICD-10-CM | POA: Diagnosis not present

## 2024-07-04 NOTE — H&P (View-Only) (Signed)
   Subjective:    Patient ID: Adam Ballard, male    DOB: [birth year], 63 y.o.   MRN: [id number]  Discussed the use of AI scribe software for clinical note transcription with the patient, who gave verbal consent to proceed.  History of Present Illness ISIDRO Ballard is a 63 year old male with severe obstructive sleep apnea who presents for evaluation of a hypoglossal neurostimulator device.  He has severe obstructive sleep apnea confirmed by a prior sleep study. CPAP therapy was attempted but discontinued due to discomfort and intolerance, including issues with noise and hose entanglement. Significant snoring and frequent awakenings affect his wife's ability to sleep in the same room. Symptoms worsen when sleeping on his back, necessitating side sleeping. He has not tried a dental appliance for management.  He maintains an active lifestyle, engaging in activities like disc golf. He is concerned about how a hypoglossal neurostimulator device might affect his ability to remain active.     Significant tests/ events reviewed  NPSG-07/2008 >> severe obstructive sleep apnea , ahi 30/h, corrected by CPAP 10 cm medium full face mask. Central apneas emerged early on CPAP   Review of Systems  neg for any significant sore throat, dysphagia, itching, sneezing, nasal congestion or excess/ purulent secretions, fever, chills, sweats, unintended wt loss, pleuritic or exertional cp, hempoptysis, orthopnea pnd or change in chronic leg swelling. Also denies presyncope, palpitations, heartburn, abdominal pain, nausea, vomiting, diarrhea or change in bowel or urinary habits, dysuria,hematuria, rash, arthralgias, visual complaints, headache, numbness weakness or ataxia.      Objective:   Physical Exam  Gen. Pleasant, well-nourished, in no distress ENT - no thrush, no pallor/icterus,no post nasal drip Neck: No JVD, no thyromegaly, no carotid bruits Lungs: no use of accessory muscles, no dullness to  percussion, clear without rales or rhonchi  Cardiovascular: Rhythm regular, heart sounds  normal, no murmurs or gallops, no peripheral edema Musculoskeletal: No deformities, no cyanosis or clubbing         Assessment & Plan:   Assessment and Plan Assessment & Plan Severe obstructive sleep apnea intolerant of CPAP therapy Severe obstructive sleep apnea with intolerance to CPAP therapy. Previous sleep study indicated apnea-hypopnea index of 30 events per hour. Symptoms include snoring and daytime fatigue. CPAP therapy was not tolerated due to discomfort and noise issues. Considering hypoglossal neurostimulator (Inspire) as an alternative treatment. Inspire involves implanting a device to stimulate the hypoglossal nerve, potentially reducing apnea events and improving sleep quality. Risks include surgical risks and potential ineffectiveness if airway closure is side-to-side. Benefits include potential reduction in cardiac risk and improvement in sleep quality. Insurance coverage is anticipated if criteria are met. He is active and not overweight, which may increase the likelihood of success with Inspire. - Ordered home sleep study to confirm current apnea-hypopnea index. - Scheduled endoscopy to assess airway closure pattern. - If criteria are met, will refer to ENT surgeon for Inspire implantation. - Post-implantation, will provide remote and activate device after one month of healing. - Will repeat sleep study post-activation to assess efficacy and adjust current settings.

## 2024-07-04 NOTE — Patient Instructions (Signed)
  VISIT SUMMARY: You came in today to discuss your severe obstructive sleep apnea and the possibility of using a hypoglossal neurostimulator device as an alternative treatment to CPAP therapy, which you found intolerable.  YOUR PLAN: -SEVERE OBSTRUCTIVE SLEEP APNEA INTOLERANT OF CPAP THERAPY: Severe obstructive sleep apnea is a condition where your airway becomes blocked during sleep, causing breathing interruptions. You have not tolerated CPAP therapy due to discomfort and noise. We are considering a hypoglossal neurostimulator device (Inspire) as an alternative. This device stimulates the hypoglossal nerve to help keep your airway open during sleep. We will first confirm your current apnea-hypopnea index with a home sleep study and assess your airway closure pattern with an endoscopy. If you meet the criteria, we will refer you to an ENT surgeon for the [facility] implantation. After the device is implanted and has healed for one month, we will activate it and conduct another sleep study to evaluate its effectiveness and adjust the settings if needed.  INSTRUCTIONS: Please complete the home sleep study as ordered and attend the scheduled endoscopy to assess your airway closure pattern. If you meet the criteria, we will proceed with the referral to an ENT surgeon for the [facility] implantation. After the implantation, there will be a one-month healing period before the device is activated. We will then conduct a follow-up sleep study to ensure the device is working effectively and make any necessary adjustments.                      Contains text generated by [employer].                                 Contains text generated by [employer]bridge.

## 2024-07-04 NOTE — Telephone Encounter (Signed)
 Please schedule the following & inform inspire rep:  Provider performing procedure:Tola Meas Diagnosis: OSA Which side if for nodule / mass? NA Procedure: DISE  Has patient been spoken to by Provider and given informed consent? Y Anesthesia: Propofol  Do you need Fluro? NA Duration of procedure: 30 m Date: 12/2 Alternate Date: 12/3  Time: AM Location: [facility] Does patient have OSA? Y DM? NA Or Latex allergy? NA Medication Restriction/ Anticoagulate/Antiplatelet: NA Pre-op Labs Ordered:determined by Anesthesia Imaging request: NA

## 2024-07-04 NOTE — Progress Notes (Signed)
   Subjective:    Patient ID: Norleen LELON Hickory, male    DOB: [birth year], 63 y.o.   MRN: [id number]  Discussed the use of AI scribe software for clinical note transcription with the patient, who gave verbal consent to proceed.  History of Present Illness [name] is a 63 year old male with severe obstructive sleep apnea who presents for evaluation of a hypoglossal neurostimulator device.  He has severe obstructive sleep apnea confirmed by a prior sleep study. CPAP therapy was attempted but discontinued due to discomfort and intolerance, including issues with noise and hose entanglement. Significant snoring and frequent awakenings affect his wife's ability to sleep in the same room. Symptoms worsen when sleeping on his back, necessitating side sleeping. He has not tried a dental appliance for management.  He maintains an active lifestyle, engaging in activities like disc golf. He is concerned about how a hypoglossal neurostimulator device might affect his ability to remain active.     Significant tests/ events reviewed  NPSG-07/2008 >> severe obstructive sleep apnea , ahi 30/h, corrected by CPAP 10 cm medium full face mask. Central apneas emerged early on CPAP   Review of Systems  neg for any significant sore throat, dysphagia, itching, sneezing, nasal congestion or excess/ purulent secretions, fever, chills, sweats, unintended wt loss, pleuritic or exertional cp, hempoptysis, orthopnea pnd or change in chronic leg swelling. Also denies presyncope, palpitations, heartburn, abdominal pain, nausea, vomiting, diarrhea or change in bowel or urinary habits, dysuria,hematuria, rash, arthralgias, visual complaints, headache, numbness weakness or ataxia.      Objective:   Physical Exam  Gen. Pleasant, well-nourished, in no distress ENT - no thrush, no pallor/icterus,no post nasal drip Neck: No JVD, no thyromegaly, no carotid bruits Lungs: no use of accessory muscles, no dullness to  percussion, clear without rales or rhonchi  Cardiovascular: Rhythm regular, heart sounds  normal, no murmurs or gallops, no peripheral edema Musculoskeletal: No deformities, no cyanosis or clubbing         Assessment & Plan:   Assessment and Plan Assessment & Plan Severe obstructive sleep apnea intolerant of CPAP therapy Severe obstructive sleep apnea with intolerance to CPAP therapy. Previous sleep study indicated apnea-hypopnea index of 30 events per hour. Symptoms include snoring and daytime fatigue. CPAP therapy was not tolerated due to discomfort and noise issues. Considering hypoglossal neurostimulator (Inspire) as an alternative treatment. Inspire involves implanting a device to stimulate the hypoglossal nerve, potentially reducing apnea events and improving sleep quality. Risks include surgical risks and potential ineffectiveness if airway closure is side-to-side. Benefits include potential reduction in cardiac risk and improvement in sleep quality. Insurance coverage is anticipated if criteria are met. He is active and not overweight, which may increase the likelihood of success with Inspire. - Ordered home sleep study to confirm current apnea-hypopnea index. - Scheduled endoscopy to assess airway closure pattern. - If criteria are met, will refer to ENT surgeon for Inspire implantation. - Post-implantation, will provide remote and activate device after one month of healing. - Will repeat sleep study post-activation to assess efficacy and adjust current settings.

## 2024-07-04 NOTE — Telephone Encounter (Signed)
 Called patient no answer, left a voicemail regarding the DISE procedure information. Updated the Inspire reps, who will be in attendance. Nothing further needed.

## 2024-07-16 ENCOUNTER — Encounter (HOSPITAL_COMMUNITY): Payer: Self-pay | Admitting: Pulmonary Disease

## 2024-07-16 ENCOUNTER — Ambulatory Visit (HOSPITAL_COMMUNITY): Admitting: Anesthesiology

## 2024-07-16 ENCOUNTER — Encounter (HOSPITAL_COMMUNITY)
Admission: RE | Disposition: A | Discharge status: Home / Self Care | Payer: Self-pay | Source: Home / Self Care | Attending: Pulmonary Disease

## 2024-07-16 ENCOUNTER — Other Ambulatory Visit: Payer: Self-pay

## 2024-07-16 ENCOUNTER — Ambulatory Visit (HOSPITAL_COMMUNITY)
Admission: RE | Admit: 2024-07-16 | Discharge: 2024-07-16 | Disposition: A | Discharge status: Home / Self Care | Attending: Pulmonary Disease | Admitting: Pulmonary Disease

## 2024-07-16 DIAGNOSIS — G4733 Obstructive sleep apnea (adult) (pediatric): Secondary | ICD-10-CM | POA: Diagnosis present

## 2024-07-16 DIAGNOSIS — I10 Essential (primary) hypertension: Secondary | ICD-10-CM | POA: Diagnosis not present

## 2024-07-16 HISTORY — PX: DRUG INDUCED ENDOSCOPY: SHX6808

## 2024-07-16 SURGERY — DRUG INDUCED SLEEP ENDOSCOPY
Anesthesia: Monitor Anesthesia Care

## 2024-07-16 MED ORDER — LIDOCAINE 2% (20 MG/ML) 5 ML SYRINGE
INTRAMUSCULAR | Status: DC | PRN
  Administered 2024-07-16: 100 mg via INTRAVENOUS

## 2024-07-16 MED ORDER — SODIUM CHLORIDE 0.9 % IV SOLN: INTRAVENOUS | Status: DC | PRN

## 2024-07-16 MED ORDER — SODIUM CHLORIDE 0.9 % IV SOLN: INTRAVENOUS | Status: DC

## 2024-07-16 MED ORDER — OXYMETAZOLINE HCL 0.05 % NA SOLN
NASAL | Status: AC
  Filled 2024-07-16: qty 15

## 2024-07-16 MED ORDER — LIDOCAINE HCL URETHRAL/MUCOSAL 2 % EX GEL: 1.0000 | Freq: Once | CUTANEOUS | Status: DC

## 2024-07-16 MED ORDER — PROPOFOL 500 MG/50ML IV EMUL
INTRAVENOUS | Status: DC | PRN
  Administered 2024-07-16: 50 mg via INTRAVENOUS
  Administered 2024-07-16: 40 mg via INTRAVENOUS
  Administered 2024-07-16: 50 mg via INTRAVENOUS
  Administered 2024-07-16: 10 mg via INTRAVENOUS
  Administered 2024-07-16: 50 mg via INTRAVENOUS

## 2024-07-16 MED ORDER — OXYMETAZOLINE HCL 0.05 % NA SOLN
NASAL | Status: DC | PRN
  Administered 2024-07-16: 2

## 2024-07-16 MED ORDER — PHENYLEPHRINE HCL 0.25 % NA SOLN
1.0000 | Freq: Four times a day (QID) | NASAL | Status: DC | PRN
  Filled 2024-07-16: qty 15

## 2024-07-16 NOTE — Anesthesia Postprocedure Evaluation (Signed)
 Anesthesia Post Note  Patient: Adam Ballard  Procedure(s) Performed: DRUG INDUCED SLEEP ENDOSCOPY     Patient location during evaluation: PACU Anesthesia Type: MAC Level of consciousness: awake and alert Pain management: pain level controlled Vital Signs Assessment: post-procedure vital signs reviewed and stable Respiratory status: spontaneous breathing, nonlabored ventilation, respiratory function stable and patient connected to nasal cannula oxygen Cardiovascular status: stable and blood pressure returned to baseline Postop Assessment: no apparent nausea or vomiting Anesthetic complications: no   No notable events documented.  Last Vitals:  Vitals:   07/16/24 1050 07/16/24 1100  BP: (!) 145/86 (!) 145/96  Pulse: 60 (!) 59  Resp: 16 17  Temp:    SpO2: 97% 98%    Last Pain:  Vitals:   07/16/24 1100  TempSrc:   PainSc: 0-No pain                 [name]

## 2024-07-16 NOTE — Interval H&P Note (Signed)
 History and Physical Interval Note:  07/16/2024 9:33 AM  Adam Ballard  has presented today for surgery, with the diagnosis of OSA.  The various methods of treatment have been discussed with the patient and family. After consideration of risks, benefits and other options for treatment, the patient has consented to  Procedure(s): DRUG INDUCED SLEEP ENDOSCOPY (N/A) as a surgical intervention.  The patient's history has been reviewed, patient examined, no change in status, stable for surgery.  I have reviewed the patient's chart and labs.  Questions were answered to the patient's satisfaction.     Harden ROCKFORD Samoria Fedorko

## 2024-07-16 NOTE — Discharge Instructions (Signed)
 Schedule home sleep test After this, we will schedule ENT appt

## 2024-07-16 NOTE — Anesthesia Preprocedure Evaluation (Signed)
 Anesthesia Evaluation  Patient identified by MRN, date of birth, ID band Patient awake    Reviewed: Allergy & Precautions, H&P , NPO status , Patient's Chart, lab work & pertinent test results  History of Anesthesia Complications Negative for: history of anesthetic complications  Airway Mallampati: III  TM Distance: <3 FB Neck ROM: Full    Dental no notable dental hx. (+)    Pulmonary sleep apnea    Pulmonary exam normal breath sounds clear to auscultation       Cardiovascular hypertension, (-) angina (-) Past MI Normal cardiovascular exam Rhythm:Regular Rate:Normal     Neuro/Psych  Headaches, neg Seizures PSYCHIATRIC DISORDERS Anxiety        GI/Hepatic negative GI ROS, Neg liver ROS,,,  Endo/Other  Hypothyroidism    Renal/GU negative Renal ROS  negative genitourinary   Musculoskeletal negative musculoskeletal ROS (+)    Abdominal   Peds negative pediatric ROS (+)  Hematology negative hematology ROS (+)   Anesthesia Other Findings   Reproductive/Obstetrics negative OB ROS                              Anesthesia Physical Anesthesia Plan  ASA: 3  Anesthesia Plan: MAC   Post-op Pain Management:    Induction: Intravenous  PONV Risk Score and Plan: 1 and Propofol  infusion and Treatment may vary due to age or medical condition  Airway Management Planned: Natural Airway  Additional Equipment: None  Intra-op Plan:   Post-operative Plan:   Informed Consent: I have reviewed the patients History and Physical, chart, labs and discussed the procedure including the risks, benefits and alternatives for the proposed anesthesia with the patient or authorized representative who has indicated his/her understanding and acceptance.     Dental advisory given  Plan Discussed with: CRNA  Anesthesia Plan Comments:          Anesthesia Quick Evaluation

## 2024-07-16 NOTE — Transfer of Care (Signed)
 Immediate Anesthesia Transfer of Care Note  Patient: Adam Ballard  Procedure(s) Performed: DRUG INDUCED SLEEP ENDOSCOPY  Patient Location: PACU and Endoscopy Unit  Anesthesia Type:MAC  Level of Consciousness: awake, alert , and oriented  Airway & Oxygen Therapy: Patient Spontanous Breathing and Patient connected to nasal cannula oxygen  Post-op Assessment: Report given to RN and Post -op Vital signs reviewed and stable  Post vital signs: Reviewed and stable  Last Vitals:  Vitals Value Taken Time  BP 135/80 07/16/24 10:40  Temp 36.6 C 07/16/24 10:30  Pulse 60 07/16/24 10:41  Resp 17 07/16/24 10:41  SpO2 98 % 07/16/24 10:41  Vitals shown include unfiled device data.  Last Pain:  Vitals:   07/16/24 1040  TempSrc:   PainSc: 0-No pain         Complications: No notable events documented.

## 2024-07-16 NOTE — Op Note (Signed)
 Procedure: Evaluation of sleep-disordered breathing by examination of upper airway using an endoscope  CPT Codes: 57024 Evaluation of sleep-disordered breathing by examination of upper airway using an endoscope  Pre-Op Diagnose: Moderate /Severe obstructive sleep apnea with positive airway pressure intolerance (ICD-10 G47.33).  Post-Op Diagnosis: Moderate /Severe obstructive sleep apnea with positive pressure airway intolerance (ICD-10 G47.33).  ANESTHESIA: IV propofol   ESTIMATED BLOOD LOSS: None.  COMPLICATIONS: None. BRIEF CLINICAL HISTORY: This is a 63 year old patient with a history of moderate to severe symptomatic obstructive sleep apnea, who is intolerant and unable to achieve benefit with positive pressure therapy.He  presents today for drug-induced sleep endoscopy to better characterize  locations and pattern of obstruction and to predict appropriate medical and/or surgical options moving forward.  PROCEDURE FINDINGS: There was no evidence of complete concentric palatal obstruction and he is a candidate anatomically for hypoglossal nerve stimulation therapy.  DESCRIPTION OF PROCEDURE: The patient was brought to the endoscopy room and was anesthetized via the standard drug-induced sleep endoscopy protocol. The propofol  infusion rate was started at 75 mcg and gradually increased at which point, conditions that mimic sleep were gradually observed.   With the patient not responsive to verbal commands, but still with spontaneous respiration, sleep disordered breathing events and associated desaturations were clearly observed  Under these conditions, the flexible endoscope was inserted to examine both sides of the nose as well as the pharynx and larynx.  The VOTE score at baseline was partial AP , complete AP , partial AP, partial AP.  With simulated jaw advancement and tongue advancement, the hypopharyngeal obstruction and secondarily the palatal collapse also improved.  In summary,  there was no evidence of complete concentric palatal obstruction and he is a candidate anatomically for hypoglossal nerve stimulation therapy. See pics in media tab  I was present for and performed the entire procedure.  Dictated By: Harden ROCKFORD Jude MD  Post-Op Plan: ENT evaluation for implantation  Diagnostic Codes: G47.33 Obstructive sleep apnea (adult)

## 2024-07-16 NOTE — Anesthesia Procedure Notes (Signed)
 Procedure Name: MAC Date/Time: 07/16/2024 10:41 AM  Performed by: Obadiah Reyes BROCKS, CRNAPre-anesthesia Checklist: Patient identified, Emergency Drugs available, Suction available and Patient being monitored Patient Re-evaluated:Patient Re-evaluated prior to induction Oxygen Delivery Method: Simple face mask Preoxygenation: Pre-oxygenation with 100% oxygen Induction Type: IV induction

## 2024-07-17 ENCOUNTER — Encounter (HOSPITAL_COMMUNITY): Payer: Self-pay | Admitting: Pulmonary Disease

## 2024-07-25 ENCOUNTER — Encounter

## 2024-07-25 DIAGNOSIS — G4733 Obstructive sleep apnea (adult) (pediatric): Secondary | ICD-10-CM

## 2024-07-29 ENCOUNTER — Encounter (HOSPITAL_BASED_OUTPATIENT_CLINIC_OR_DEPARTMENT_OTHER): Payer: Self-pay | Admitting: Pulmonary Disease

## 2024-07-29 NOTE — Telephone Encounter (Signed)
 Please advise I see no referral to ENT only for the DYSE procedure

## 2024-08-19 ENCOUNTER — Telehealth: Payer: Self-pay | Admitting: Pulmonary Disease

## 2024-08-19 DIAGNOSIS — G4733 Obstructive sleep apnea (adult) (pediatric): Secondary | ICD-10-CM

## 2024-08-19 NOTE — Telephone Encounter (Signed)
 His HST showed severe OSA with AHI 60/h however a lot of central apneas were noted. I am trying to manually look at raw data to see if the study can be rescored to qualify him for inspire. Will get back to him by next week

## 2024-08-22 DIAGNOSIS — G4739 Other sleep apnea: Secondary | ICD-10-CM | POA: Diagnosis not present

## 2024-08-22 NOTE — Addendum Note (Signed)
 Addended by: Karysa Heft L on: 08/22/2024 03:24 PM   Modules accepted: Orders

## 2024-08-22 NOTE — Telephone Encounter (Signed)
 He had 22% central /mixed apneas Ok to proceed with ENT referral for inspire.

## 2024-08-22 NOTE — Telephone Encounter (Signed)
 Which ENT? Is Carlie correct?

## 2024-09-27 ENCOUNTER — Institutional Professional Consult (permissible substitution) (INDEPENDENT_AMBULATORY_CARE_PROVIDER_SITE_OTHER)
